# Patient Record
Sex: Male | Born: 1945
Health system: Southern US, Community
[De-identification: ages and names within clinical notes are randomized; demographics above are authoritative.]

## PROBLEM LIST (undated history)

## (undated) DIAGNOSIS — I10 Essential (primary) hypertension: Secondary | ICD-10-CM

## (undated) DIAGNOSIS — N138 Other obstructive and reflux uropathy: Secondary | ICD-10-CM

## (undated) DIAGNOSIS — C641 Malignant neoplasm of right kidney, except renal pelvis: Secondary | ICD-10-CM

## (undated) DIAGNOSIS — R7303 Prediabetes: Secondary | ICD-10-CM

## (undated) DIAGNOSIS — E7800 Pure hypercholesterolemia, unspecified: Secondary | ICD-10-CM

## (undated) DIAGNOSIS — N401 Enlarged prostate with lower urinary tract symptoms: Secondary | ICD-10-CM

## (undated) HISTORY — DX: Other obstructive and reflux uropathy: N13.8

## (undated) HISTORY — DX: Essential (primary) hypertension: I10

## (undated) HISTORY — DX: Pure hypercholesterolemia, unspecified: E78.00

## (undated) HISTORY — DX: Benign prostatic hyperplasia with lower urinary tract symptoms: N40.1

## (undated) HISTORY — DX: Prediabetes: R73.03

## (undated) HISTORY — DX: Malignant neoplasm of right kidney, except renal pelvis: C64.1

---

## 2005-09-08 HISTORY — PX: OTHER SURGICAL HISTORY: SHX169

## 2013-06-17 ENCOUNTER — Other Ambulatory Visit: Payer: Self-pay | Admitting: Internal Medicine

## 2013-06-17 DIAGNOSIS — R1900 Intra-abdominal and pelvic swelling, mass and lump, unspecified site: Secondary | ICD-10-CM

## 2013-06-22 ENCOUNTER — Ambulatory Visit
Admission: RE | Admit: 2013-06-22 | Discharge: 2013-06-22 | Disposition: A | Discharge status: Home / Self Care | Payer: Medicare Other | Source: Ambulatory Visit | Attending: Internal Medicine | Admitting: Internal Medicine

## 2013-06-22 DIAGNOSIS — R1900 Intra-abdominal and pelvic swelling, mass and lump, unspecified site: Secondary | ICD-10-CM

## 2013-06-22 MED ORDER — IOHEXOL 300 MG/ML  SOLN
100.0000 mL | Freq: Once | INTRAMUSCULAR | Status: AC | PRN
  Administered 2013-06-22: 100 mL via INTRAVENOUS

## 2013-07-13 ENCOUNTER — Other Ambulatory Visit: Payer: Self-pay | Admitting: Urology

## 2013-07-13 DIAGNOSIS — D49519 Neoplasm of unspecified behavior of unspecified kidney: Secondary | ICD-10-CM

## 2013-07-19 ENCOUNTER — Ambulatory Visit (HOSPITAL_COMMUNITY)
Admission: RE | Admit: 2013-07-19 | Discharge: 2013-07-19 | Disposition: A | Discharge status: Home / Self Care | Payer: Medicare Other | Source: Ambulatory Visit | Attending: Urology | Admitting: Urology

## 2013-07-19 DIAGNOSIS — D49519 Neoplasm of unspecified behavior of unspecified kidney: Secondary | ICD-10-CM

## 2013-07-19 DIAGNOSIS — N289 Disorder of kidney and ureter, unspecified: Secondary | ICD-10-CM | POA: Insufficient documentation

## 2013-07-19 DIAGNOSIS — D4959 Neoplasm of unspecified behavior of other genitourinary organ: Secondary | ICD-10-CM | POA: Insufficient documentation

## 2013-07-19 MED ORDER — GADOBENATE DIMEGLUMINE 529 MG/ML IV SOLN
17.0000 mL | Freq: Once | INTRAVENOUS | Status: AC | PRN
  Administered 2013-07-19: 17 mL via INTRAVENOUS

## 2013-07-27 ENCOUNTER — Other Ambulatory Visit: Payer: Self-pay | Admitting: Urology

## 2013-09-01 ENCOUNTER — Inpatient Hospital Stay (HOSPITAL_COMMUNITY): Admission: RE | Admit: 2013-09-01 | Payer: Medicare Other | Source: Ambulatory Visit | Admitting: Urology

## 2013-09-01 ENCOUNTER — Encounter (HOSPITAL_COMMUNITY): Admission: RE | Payer: Self-pay | Source: Ambulatory Visit

## 2013-09-01 SURGERY — ROBOTIC ASSISTED LAPAROSCOPIC NEPHRECTOMY
Anesthesia: General | Laterality: Right

## 2013-10-06 HISTORY — PX: OTHER SURGICAL HISTORY: SHX169

## 2016-03-26 DIAGNOSIS — Z0000 Encounter for general adult medical examination without abnormal findings: Secondary | ICD-10-CM | POA: Diagnosis not present

## 2016-03-26 DIAGNOSIS — N182 Chronic kidney disease, stage 2 (mild): Secondary | ICD-10-CM | POA: Diagnosis not present

## 2016-03-26 DIAGNOSIS — I129 Hypertensive chronic kidney disease with stage 1 through stage 4 chronic kidney disease, or unspecified chronic kidney disease: Secondary | ICD-10-CM | POA: Diagnosis not present

## 2016-03-26 DIAGNOSIS — E559 Vitamin D deficiency, unspecified: Secondary | ICD-10-CM | POA: Diagnosis not present

## 2016-03-26 DIAGNOSIS — E785 Hyperlipidemia, unspecified: Secondary | ICD-10-CM | POA: Diagnosis not present

## 2016-03-26 DIAGNOSIS — N390 Urinary tract infection, site not specified: Secondary | ICD-10-CM | POA: Diagnosis not present

## 2016-03-26 DIAGNOSIS — R7309 Other abnormal glucose: Secondary | ICD-10-CM | POA: Diagnosis not present

## 2016-04-13 IMAGING — CT CT ABD-PELV W/ CM
2 of 5 series · 13 of 36 positions shown, 18 images · IV contrast (READICAT/WATER & [ID] OMNI 300)
Comparison: Outside ultrasound report dated 06/09/2013.

CLINICAL DATA: Abdominal mass identified on outside ultrasound. No
current complaints.

EXAM:
CT ABDOMEN AND PELVIS WITH CONTRAST
TECHNIQUE: Multidetector CT imaging of the abdomen and pelvis was performed
using the standard protocol following bolus administration of
intravenous contrast.
CONTRAST:  100mL OMNIPAQUE IOHEXOL 300 MG/ML  SOLN

[Series 4: thin recons · axial · 0.78mm/px · z∈[-374,+49]mm · 12 of 392 slices shown, 16 images]
[im 36/392  soft-tissue]
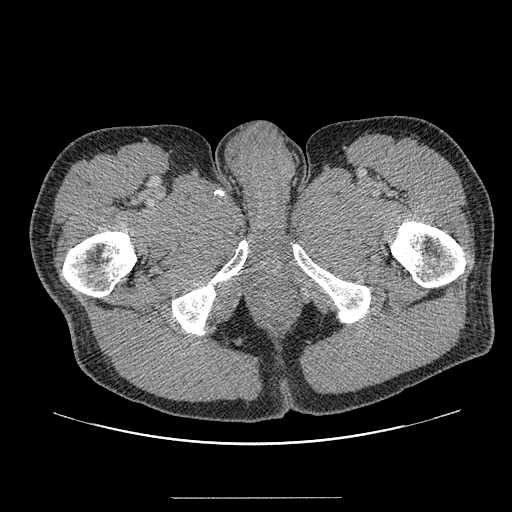
[im 36/392  bone]
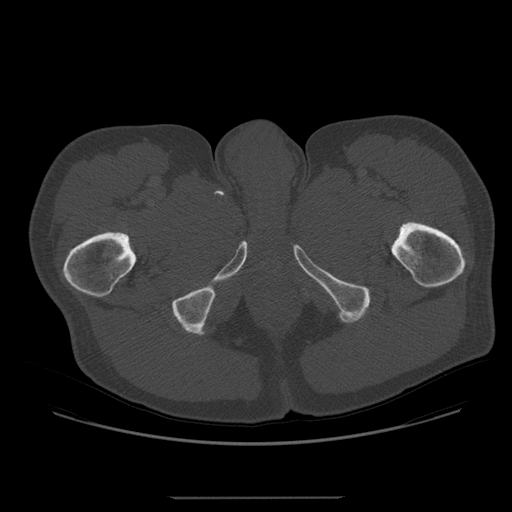
[im 72/392  soft-tissue]
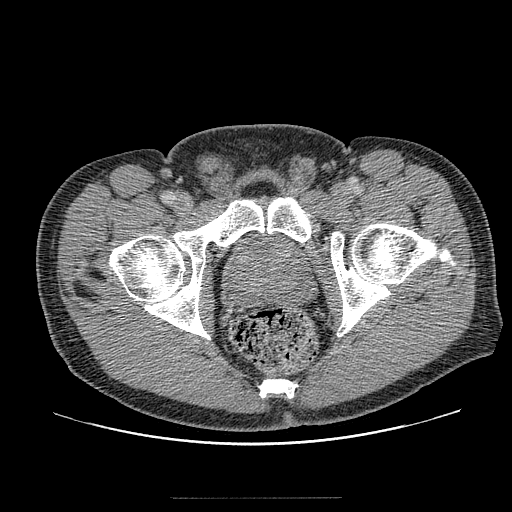
[im 107/392  soft-tissue]
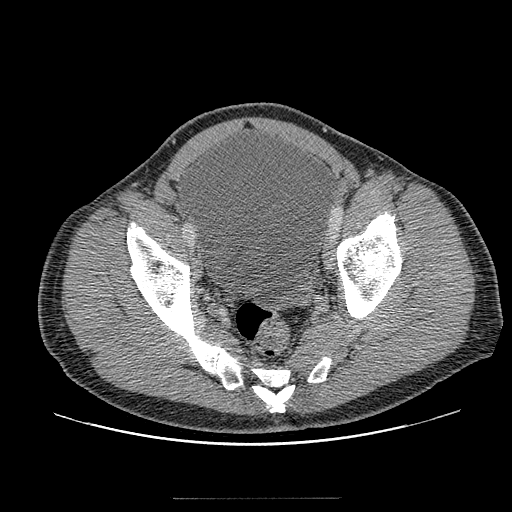
[im 143/392  soft-tissue]
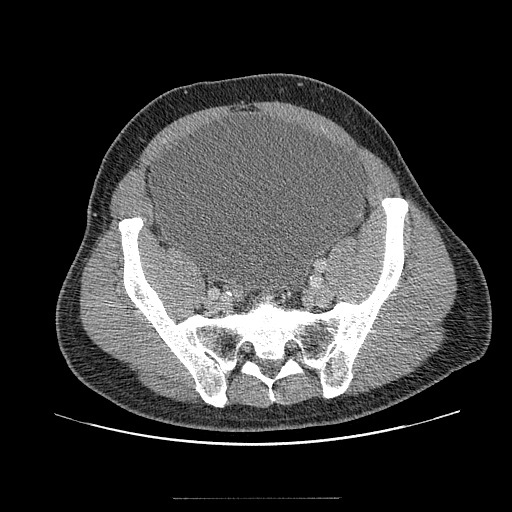
[im 178/392  soft-tissue]
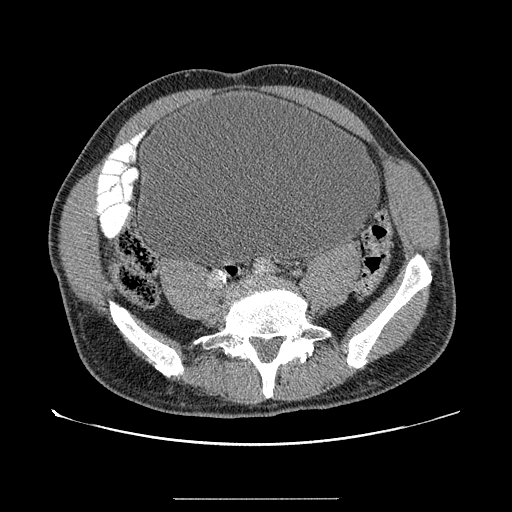
[im 214/392  soft-tissue]
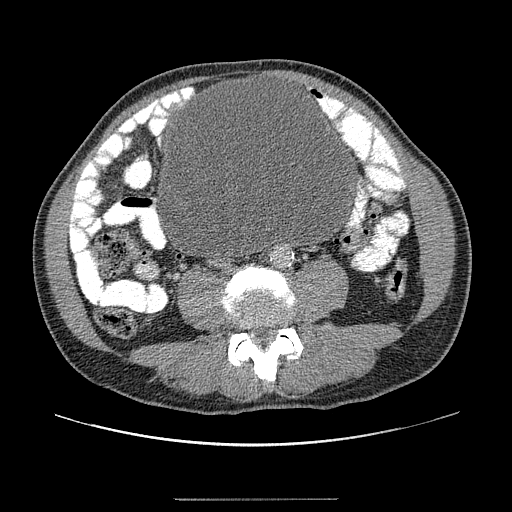
[im 249/392  soft-tissue]
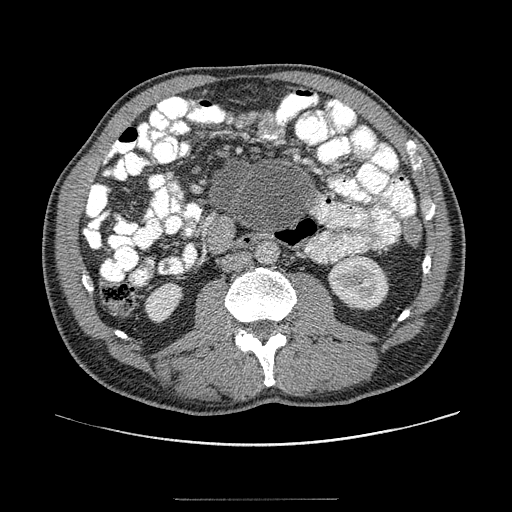
[im 285/392  soft-tissue]
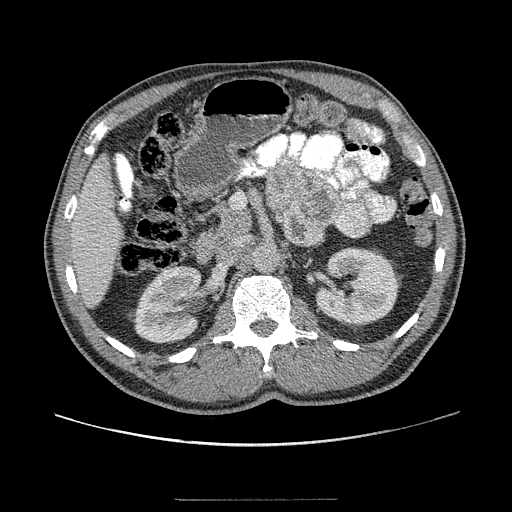
[im 320/392  soft-tissue]
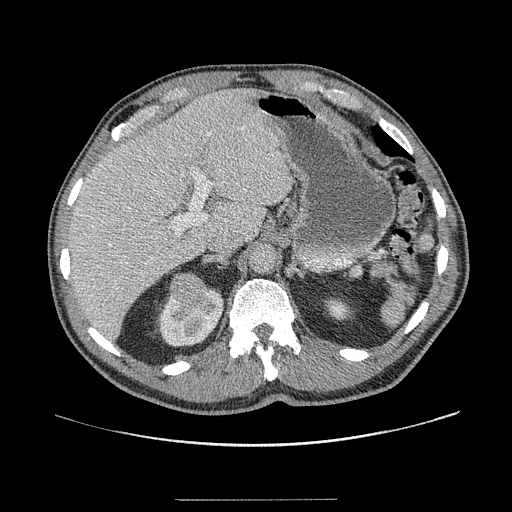
[im 320/392  lung]
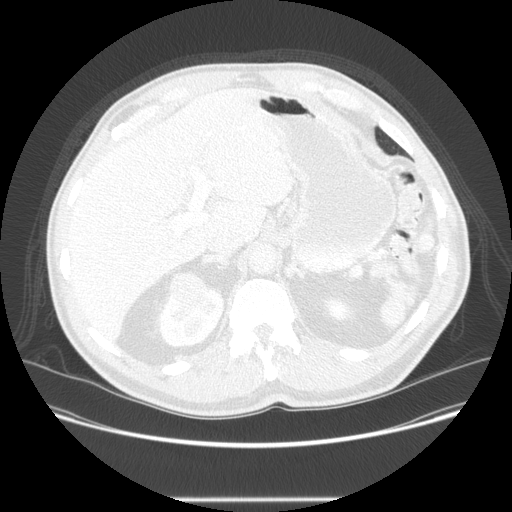
[im 320/392  bone]
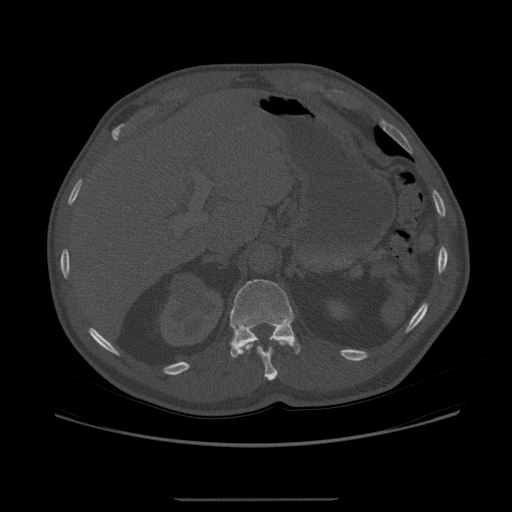
[im 338/392  lung]
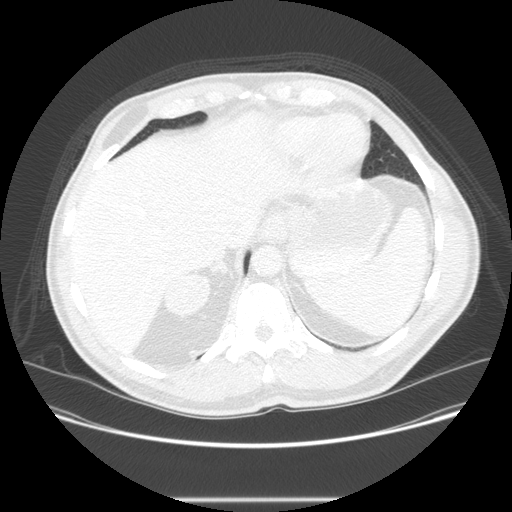
[im 356/392  soft-tissue]
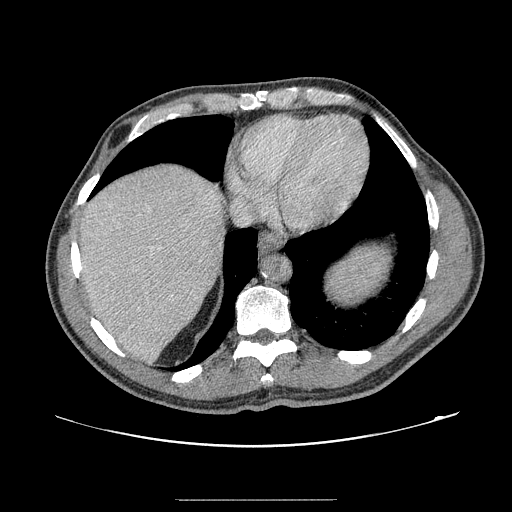
[im 356/392  lung]
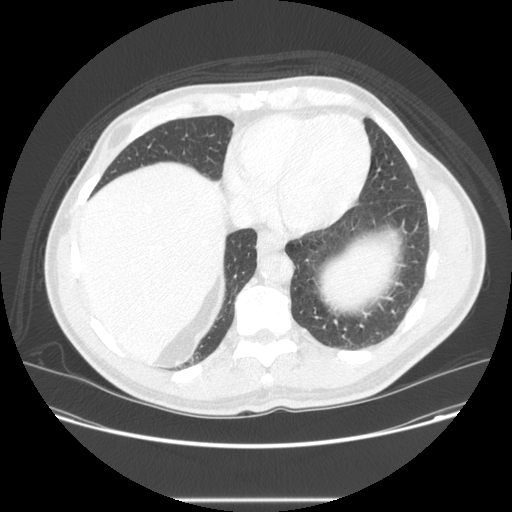
[im 374/392  lung]
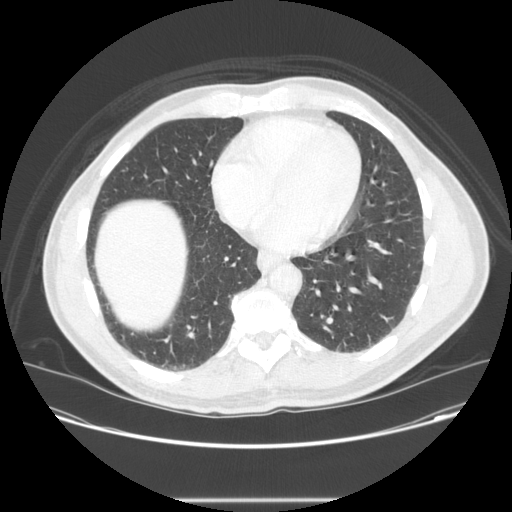

[Series 401: sagittal · sagittal · 0.98mm/px · 1 of 147 slices shown, 2 images]
[im 49/147  soft-tissue]
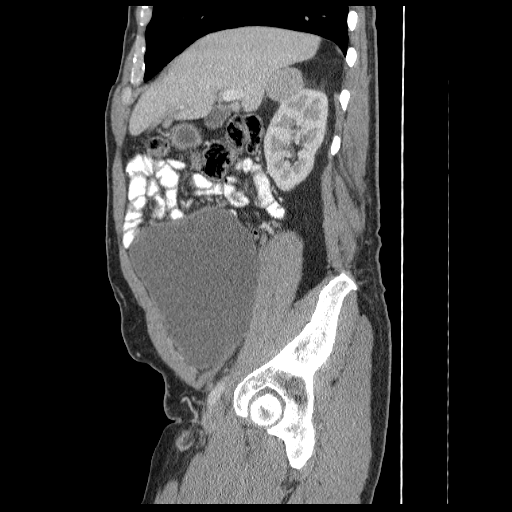
[im 49/147  bone]
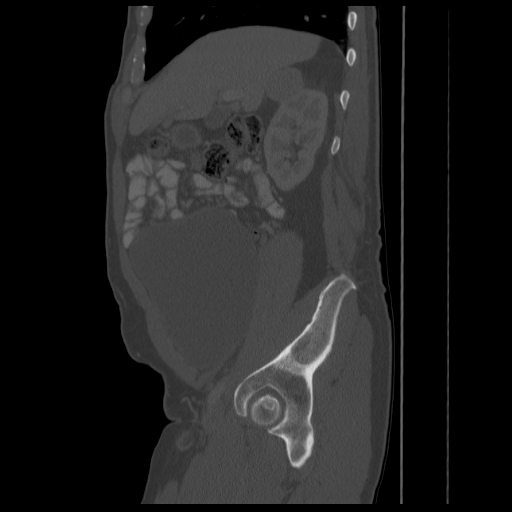

[13 of 36 positions shown; findings below may reference images not displayed]

FINDINGS: Lower Chest: Clear lung bases. Normal heart size without pericardial
or pleural effusion.

Abdomen/Pelvis: Normal liver, spleen, stomach. Mild pancreatic
atrophy. Normal gallbladder, biliary tract, adrenal glands, right
kidney.

An upper pole left renal mass measures 3.9 x 4.4 cm on image
17/series 2. Measures up to the 89 HU on corticomedullary phase
imaging. Appears to "Baksh" on nephrographic phase images.
Aortic and branch vessel atherosclerosis. No retroperitoneal or
retrocrural adenopathy. Right renal vein patent.

Moderate stool within the rectum. Scattered colonic diverticula.
Normal terminal ileum and appendix. Normal small bowel without
abdominal ascites. No pelvic adenopathy. Moderate prostatomegaly. A
pelvic cystic structure which extends into the mid abdomen (to the
level of the kidneys) is favored to represent a distended urinary
bladder. Measures maximally 23 cm craniocaudal. No significant free
fluid.

Bones/Musculoskeletal: Presumed degenerative partial fusion of the
bilateral sacroiliac joints. Left iliac sclerotic lesion which is
likely a bone island. Right intercostal lipoma measures 4.7 cm on
image 14.
IMPRESSION: 1. Cystic structure within the midline of the pelvis, extending into
the upper abdomen, is favored to represent a markedly dilated
urinary bladder. Consider focus (renal or pelvic) ultrasound with
pre and postvoid imaging to confirm. No separate compressed urinary
bladder is identified.
2. Upper pole right renal lesion, highly suspicious for renal cell
carcinoma. Urology consultation suggested. Definitive
characterization with pre and post contrast CT or MRI should be
considered. These results will be called to the ordering clinician
or representative by the Radiologist Assistant, and communication
documented in the PACS Dashboard.

## 2016-09-23 DIAGNOSIS — E785 Hyperlipidemia, unspecified: Secondary | ICD-10-CM | POA: Diagnosis not present

## 2016-09-23 DIAGNOSIS — N182 Chronic kidney disease, stage 2 (mild): Secondary | ICD-10-CM | POA: Diagnosis not present

## 2016-09-23 DIAGNOSIS — I129 Hypertensive chronic kidney disease with stage 1 through stage 4 chronic kidney disease, or unspecified chronic kidney disease: Secondary | ICD-10-CM | POA: Diagnosis not present

## 2016-09-23 DIAGNOSIS — R7309 Other abnormal glucose: Secondary | ICD-10-CM | POA: Diagnosis not present

## 2016-10-11 DIAGNOSIS — Z08 Encounter for follow-up examination after completed treatment for malignant neoplasm: Secondary | ICD-10-CM | POA: Diagnosis not present

## 2016-10-11 DIAGNOSIS — Z85528 Personal history of other malignant neoplasm of kidney: Secondary | ICD-10-CM | POA: Diagnosis not present

## 2016-10-11 DIAGNOSIS — N400 Enlarged prostate without lower urinary tract symptoms: Secondary | ICD-10-CM | POA: Diagnosis not present

## 2016-10-11 DIAGNOSIS — R911 Solitary pulmonary nodule: Secondary | ICD-10-CM | POA: Diagnosis not present

## 2016-10-11 DIAGNOSIS — M7989 Other specified soft tissue disorders: Secondary | ICD-10-CM | POA: Diagnosis not present

## 2016-10-11 DIAGNOSIS — C641 Malignant neoplasm of right kidney, except renal pelvis: Secondary | ICD-10-CM | POA: Diagnosis not present

## 2016-10-11 DIAGNOSIS — Z9889 Other specified postprocedural states: Secondary | ICD-10-CM | POA: Diagnosis not present

## 2016-10-11 DIAGNOSIS — G9589 Other specified diseases of spinal cord: Secondary | ICD-10-CM | POA: Diagnosis not present

## 2016-10-11 DIAGNOSIS — K449 Diaphragmatic hernia without obstruction or gangrene: Secondary | ICD-10-CM | POA: Diagnosis not present

## 2016-10-11 DIAGNOSIS — N138 Other obstructive and reflux uropathy: Secondary | ICD-10-CM | POA: Diagnosis not present

## 2016-10-11 DIAGNOSIS — M5384 Other specified dorsopathies, thoracic region: Secondary | ICD-10-CM | POA: Diagnosis not present

## 2016-10-11 DIAGNOSIS — N401 Enlarged prostate with lower urinary tract symptoms: Secondary | ICD-10-CM | POA: Diagnosis not present

## 2016-10-11 DIAGNOSIS — Z905 Acquired absence of kidney: Secondary | ICD-10-CM | POA: Diagnosis not present

## 2016-11-06 DIAGNOSIS — R937 Abnormal findings on diagnostic imaging of other parts of musculoskeletal system: Secondary | ICD-10-CM | POA: Diagnosis not present

## 2016-11-06 DIAGNOSIS — Z85528 Personal history of other malignant neoplasm of kidney: Secondary | ICD-10-CM | POA: Diagnosis not present

## 2016-12-19 DIAGNOSIS — M899 Disorder of bone, unspecified: Secondary | ICD-10-CM | POA: Diagnosis not present

## 2016-12-19 DIAGNOSIS — R948 Abnormal results of function studies of other organs and systems: Secondary | ICD-10-CM | POA: Diagnosis not present

## 2017-01-02 DIAGNOSIS — R935 Abnormal findings on diagnostic imaging of other abdominal regions, including retroperitoneum: Secondary | ICD-10-CM | POA: Diagnosis not present

## 2017-02-13 DIAGNOSIS — R933 Abnormal findings on diagnostic imaging of other parts of digestive tract: Secondary | ICD-10-CM | POA: Diagnosis not present

## 2017-02-13 DIAGNOSIS — K640 First degree hemorrhoids: Secondary | ICD-10-CM | POA: Diagnosis not present

## 2017-02-13 DIAGNOSIS — K5730 Diverticulosis of large intestine without perforation or abscess without bleeding: Secondary | ICD-10-CM | POA: Diagnosis not present

## 2017-04-17 DIAGNOSIS — N182 Chronic kidney disease, stage 2 (mild): Secondary | ICD-10-CM | POA: Diagnosis not present

## 2017-04-17 DIAGNOSIS — Z79899 Other long term (current) drug therapy: Secondary | ICD-10-CM | POA: Diagnosis not present

## 2017-04-17 DIAGNOSIS — I129 Hypertensive chronic kidney disease with stage 1 through stage 4 chronic kidney disease, or unspecified chronic kidney disease: Secondary | ICD-10-CM | POA: Diagnosis not present

## 2017-04-17 DIAGNOSIS — E785 Hyperlipidemia, unspecified: Secondary | ICD-10-CM | POA: Diagnosis not present

## 2017-04-29 DIAGNOSIS — R7309 Other abnormal glucose: Secondary | ICD-10-CM | POA: Diagnosis not present

## 2017-04-29 DIAGNOSIS — E785 Hyperlipidemia, unspecified: Secondary | ICD-10-CM | POA: Diagnosis not present

## 2017-04-29 DIAGNOSIS — I129 Hypertensive chronic kidney disease with stage 1 through stage 4 chronic kidney disease, or unspecified chronic kidney disease: Secondary | ICD-10-CM | POA: Diagnosis not present

## 2017-04-29 DIAGNOSIS — M545 Low back pain: Secondary | ICD-10-CM | POA: Diagnosis not present

## 2017-04-29 DIAGNOSIS — N182 Chronic kidney disease, stage 2 (mild): Secondary | ICD-10-CM | POA: Diagnosis not present

## 2017-09-04 DIAGNOSIS — R7309 Other abnormal glucose: Secondary | ICD-10-CM | POA: Diagnosis not present

## 2017-09-04 DIAGNOSIS — Z1389 Encounter for screening for other disorder: Secondary | ICD-10-CM | POA: Diagnosis not present

## 2017-09-04 DIAGNOSIS — N182 Chronic kidney disease, stage 2 (mild): Secondary | ICD-10-CM | POA: Diagnosis not present

## 2017-09-04 DIAGNOSIS — I129 Hypertensive chronic kidney disease with stage 1 through stage 4 chronic kidney disease, or unspecified chronic kidney disease: Secondary | ICD-10-CM | POA: Diagnosis not present

## 2017-09-04 LAB — HEPATIC FUNCTION PANEL
ALT: 21 (ref 10–40)
AST: 24 (ref 14–40)
Alkaline Phosphatase: 85 (ref 25–125)

## 2017-09-04 LAB — BASIC METABOLIC PANEL
Creatinine: 1.2 (ref 0.6–1.3)
GLUCOSE: 97
POTASSIUM: 3.5 (ref 3.4–5.3)
SODIUM: 142 (ref 137–147)

## 2017-09-04 LAB — HEMOGLOBIN A1C: Hemoglobin A1C: 6

## 2017-09-04 LAB — PSA: PSA: 0.7

## 2018-01-05 ENCOUNTER — Encounter: Payer: Self-pay | Admitting: Internal Medicine

## 2018-01-05 DIAGNOSIS — R7309 Other abnormal glucose: Secondary | ICD-10-CM | POA: Insufficient documentation

## 2018-01-08 ENCOUNTER — Encounter: Payer: Self-pay | Admitting: Internal Medicine

## 2018-01-08 ENCOUNTER — Ambulatory Visit: Payer: Medicare Other | Admitting: Internal Medicine

## 2018-01-08 VITALS — BP 130/78 | HR 82 | Temp 97.6°F | Ht 74.0 in | Wt 180.2 lb

## 2018-01-08 DIAGNOSIS — R7309 Other abnormal glucose: Secondary | ICD-10-CM

## 2018-01-08 DIAGNOSIS — N182 Chronic kidney disease, stage 2 (mild): Secondary | ICD-10-CM

## 2018-01-08 DIAGNOSIS — I129 Hypertensive chronic kidney disease with stage 1 through stage 4 chronic kidney disease, or unspecified chronic kidney disease: Secondary | ICD-10-CM | POA: Diagnosis not present

## 2018-01-08 DIAGNOSIS — E7800 Pure hypercholesterolemia, unspecified: Secondary | ICD-10-CM | POA: Diagnosis not present

## 2018-01-08 LAB — HEMOGLOBIN A1C
Est. average glucose Bld gHb Est-mCnc: 120 mg/dL
HEMOGLOBIN A1C: 5.8 % — AB (ref 4.8–5.6)

## 2018-01-08 LAB — CMP14+EGFR
ALBUMIN: 4.2 g/dL (ref 3.5–4.8)
ALK PHOS: 76 IU/L (ref 39–117)
ALT: 15 IU/L (ref 0–44)
AST: 17 IU/L (ref 0–40)
Albumin/Globulin Ratio: 1.4 (ref 1.2–2.2)
BILIRUBIN TOTAL: 0.5 mg/dL (ref 0.0–1.2)
BUN/Creatinine Ratio: 17 (ref 10–24)
BUN: 20 mg/dL (ref 8–27)
CHLORIDE: 100 mmol/L (ref 96–106)
CO2: 28 mmol/L (ref 20–29)
Calcium: 9.7 mg/dL (ref 8.6–10.2)
Creatinine, Ser: 1.16 mg/dL (ref 0.76–1.27)
GFR calc Af Amer: 72 mL/min/{1.73_m2} (ref >59)
GFR calc non Af Amer: 62 mL/min/{1.73_m2} (ref >59)
GLUCOSE: 88 mg/dL (ref 65–99)
Globulin, Total: 2.9 g/dL (ref 1.5–4.5)
Potassium: 3.6 mmol/L (ref 3.5–5.2)
Sodium: 142 mmol/L (ref 134–144)
TOTAL PROTEIN: 7.1 g/dL (ref 6.0–8.5)

## 2018-01-08 LAB — LIPID PANEL
CHOLESTEROL TOTAL: 164 mg/dL (ref 100–199)
Chol/HDL Ratio: 3.1 ratio (ref 0.0–5.0)
HDL: 53 mg/dL (ref >39)
LDL Calculated: 101 mg/dL — ABNORMAL HIGH (ref 0–99)
TRIGLYCERIDES: 51 mg/dL (ref 0–149)
VLDL Cholesterol Cal: 10 mg/dL (ref 5–40)

## 2018-01-08 NOTE — Progress Notes (Signed)
Subjective:     Patient ID: Lawrence Harrell , male    DOB: 1945/09/05 , 72 y.o.   MRN: [id number]   Chief Complaint  Patient presents with  . Hypertension  . Hyperlipidemia    HPI  Hypertension  This is a chronic problem. The current episode started more than 1 month ago. The problem has been rapidly worsening since onset. The problem is controlled. Pertinent negatives include no blurred vision, chest pain, headaches, neck pain or shortness of breath. Risk factors for coronary artery disease include dyslipidemia and male gender. The current treatment provides moderate improvement. There are no compliance problems.    He reports compliance with meds.   Hyperlipidemia  He is currently taking pravastatin without any issues. He denies having any muscle aches or other side effects from the medication.   Past Medical History:  Diagnosis Date  . BPH with obstruction/lower urinary tract symptoms   . High cholesterol   . HTN (hypertension)   . Prediabetes   . Renal cell cancer, right (HCC)      Family History  Problem Relation Age of Onset  . Hypertension Mother   . Hypertension Father      Current Outpatient Medications:  .  amLODipine (NORVASC) 10 MG tablet, Take 10 mg by mouth daily., Disp: , Rfl:  .  Cholecalciferol (VITAMIN D3) 2000 units TABS, Take by mouth., Disp: , Rfl:  .  pravastatin (PRAVACHOL) 40 MG tablet, Take 40 mg by mouth daily., Disp: , Rfl:  .  tamsulosin (FLOMAX) 0.4 MG CAPS capsule, Take 0.4 mg by mouth., Disp: , Rfl:  .  valsartan-hydrochlorothiazide (DIOVAN-HCT) 160-25 MG tablet, Take 1 tablet by mouth daily., Disp: , Rfl:    No Known Allergies   Review of Systems  Constitutional: Negative.   Eyes: Negative for blurred vision.  Respiratory: Negative.  Negative for shortness of breath.   Cardiovascular: Negative.  Negative for chest pain.  Gastrointestinal: Negative.   Endocrine: Negative.   Genitourinary: Negative.   Musculoskeletal: Negative for  neck pain.  Skin: Negative.   Neurological: Negative for headaches.  Hematological: Negative.   Psychiatric/Behavioral: Negative.      Today's Vitals   01/08/18 0906  BP: 130/78  Pulse: 82  Temp: 97.6 F (36.4 C)  TempSrc: Oral  Weight: 180 lb 3.2 oz (81.7 kg)  Height: 6' 2" (1.88 m)  PainSc: 2   PainLoc: Back   Body mass index is 23.14 kg/m.   Objective:  Physical Exam  Constitutional: He is oriented to person, place, and time. He appears well-developed and well-nourished.  HENT:  Head: Normocephalic.  Eyes: EOM are normal.  Cardiovascular: Normal rate, regular rhythm and normal heart sounds.  Pulmonary/Chest: Effort normal and breath sounds normal.  Neurological: He is alert and oriented to person, place, and time.  Skin: Skin is warm and dry.  Psychiatric: He has a normal mood and affect.  Nursing note and vitals reviewed.       Assessment And Plan:     1. Hypertensive nephropathy  Controlled. He will continue with current meds. He is encouraged to avoid adding salt to his foods.   - CMP14+EGFR  2. Chronic renal disease, stage II  Chronic. I will check GFR, CR today. He is encouraged to stay well hydrated. Importance of optimal BP control is also discussed with the patient.   3. Pure hypercholesterolemia  I will check fasting lipid panel today, along with LFTs.  HE IS ENCOURAGED TO EXERCISE FIVE DAYS WEEKLY FOR  AT LEAST 30 MINUTES, AVOID FRIED FOODS, EAT 25-35 GRAMS OF FIBER, AND TO EAT FISH AT LEAST TWICE WEEKLY.  - Lipid Profile  4. Other abnormal glucose  HIS A1C HAS BEEN ELEVATED IN THE PAST. I WILL CHECK AN A1C, BMET TODAY. HE IS ENCOURAGED TO AVOID SUGARY BEVERAGES AND PROCESSED FOODS INCLUDNG BREADS, RICE AND PASTA.   - Hemoglobin A1c        Maximino Greenland, MD

## 2018-01-09 ENCOUNTER — Encounter: Payer: Self-pay | Admitting: Internal Medicine

## 2018-01-09 NOTE — Progress Notes (Signed)
Your liver and kidney function are stable. Your hba1c is 5.8, this is better than last visit. Your chol is great. Continue with current meds.

## 2018-01-11 ENCOUNTER — Encounter: Payer: Self-pay | Admitting: Internal Medicine

## 2018-01-12 ENCOUNTER — Telehealth: Payer: Self-pay

## 2018-01-12 NOTE — Telephone Encounter (Signed)
-----   Message from Glendale Chard, MD sent at 01/09/2018  8:48 PM EDT ----- Your liver and kidney function are stable. Your hba1c is 5.8, this is better than last visit. Your chol is great. Continue with current meds.

## 2018-01-12 NOTE — Telephone Encounter (Signed)
Left the pt a message to call back for his lab results. 

## 2018-02-04 ENCOUNTER — Other Ambulatory Visit: Payer: Self-pay | Admitting: Internal Medicine

## 2018-04-06 ENCOUNTER — Ambulatory Visit (INDEPENDENT_AMBULATORY_CARE_PROVIDER_SITE_OTHER): Payer: Medicare Other | Admitting: Internal Medicine

## 2018-04-06 ENCOUNTER — Encounter: Payer: Self-pay | Admitting: Internal Medicine

## 2018-04-06 VITALS — BP 120/76 | HR 82 | Temp 98.0°F | Ht 74.0 in | Wt 183.0 lb

## 2018-04-06 DIAGNOSIS — E7800 Pure hypercholesterolemia, unspecified: Secondary | ICD-10-CM | POA: Diagnosis not present

## 2018-04-06 DIAGNOSIS — R7309 Other abnormal glucose: Secondary | ICD-10-CM

## 2018-04-06 DIAGNOSIS — I129 Hypertensive chronic kidney disease with stage 1 through stage 4 chronic kidney disease, or unspecified chronic kidney disease: Secondary | ICD-10-CM

## 2018-04-06 DIAGNOSIS — N182 Chronic kidney disease, stage 2 (mild): Secondary | ICD-10-CM

## 2018-04-06 NOTE — Progress Notes (Addendum)
Subjective:     Patient ID: Lawrence Harrell , male    DOB: [birth year] , 74 y.o.   MRN: [id number]   Chief Complaint  Patient presents with  . Hypertension    HPI  Hypertension  This is a chronic problem. The current episode started more than 1 year ago. The problem has been gradually improving since onset. The problem is controlled. Pertinent negatives include no blurred vision, chest pain, palpitations or shortness of breath. Risk factors for coronary artery disease include dyslipidemia and male gender. The current treatment provides moderate improvement. Hypertensive end-organ damage includes kidney disease.     Past Medical History:  Diagnosis Date  . BPH with obstruction/lower urinary tract symptoms   . High cholesterol   . HTN (hypertension)   . Prediabetes   . Renal cell cancer, right (HCC)      Family History  Problem Relation Age of Onset  . Hypertension Mother   . Hypertension Father      Current Outpatient Medications:  .  amLODipine (NORVASC) 10 MG tablet, TAKE 1 TABLET BY MOUTH  EVERY DAY, Disp: 90 tablet, Rfl: 1 .  Cholecalciferol (VITAMIN D3) 2000 units TABS, Take by mouth., Disp: , Rfl:  .  pravastatin (PRAVACHOL) 40 MG tablet, Take 40 mg by mouth daily., Disp: , Rfl:  .  tamsulosin (FLOMAX) 0.4 MG CAPS capsule, Take 0.4 mg by mouth., Disp: , Rfl:  .  valsartan-hydrochlorothiazide (DIOVAN-HCT) 160-25 MG tablet, TAKE 1 TABLET BY MOUTH  EVERY DAY, Disp: 90 tablet, Rfl: 1   No Known Allergies   Review of Systems  Constitutional: Negative.   Eyes: Negative for blurred vision.  Respiratory: Negative.  Negative for shortness of breath.   Cardiovascular: Negative.  Negative for chest pain and palpitations.  Gastrointestinal: Negative.   Neurological: Negative.   Psychiatric/Behavioral: Negative.      Today's Vitals   04/06/18 1027  BP: 120/76  Pulse: 82  Temp: 98 F (36.7 C)  TempSrc: Other (Comment)  SpO2: 98%  Weight: 183 lb (83 kg)  Height: 6\' 2"   (1.88 m)   Body mass index is 23.5 kg/m.   Objective:  Physical Exam Vitals signs and nursing note reviewed.  Constitutional:      Appearance: Normal appearance.  HENT:     Head: Normocephalic and atraumatic.  Cardiovascular:     Rate and Rhythm: Normal rate and regular rhythm.     Heart sounds: Normal heart sounds.  Pulmonary:     Effort: Pulmonary effort is normal.     Breath sounds: Normal breath sounds.  Skin:    General: Skin is warm.  Neurological:     General: No focal deficit present.     Mental Status: He is alert.  Psychiatric:        Mood and Affect: Mood normal.         Assessment And Plan:     1. Hypertensive nephropathy  Well controlled. He will continue with current meds. He is encouraged to avoid adding salt to his foods.   2. Chronic renal disease, stage II  Chronic. He is encouraged to stay well hydrated. I will recheck GFR, Cr in April 2020 when he returns for his AWV/physical examination.   3. Abnormal glucose  HIS  A1C HAS BEEN ELEVATED IN THE PAST. I WILL CHECK AN A1C, BMET TODAY. HE IS ENCOURAGED TO AVOID SUGARY BEVERAGES AND PROCESSED FOODS INCLUDNG BREADS, RICE AND PASTA.  - Hemoglobin A1c  4. Pure hypercholesterolemia  Chronic. He  is encouraged to limit his fried food intake and to exercise no less than five days weekly, 30 minutes each day. His most recent labs were reviewed in full detail during his visit. We also discussed how excess sugars can impact his cholesterol.   Maximino Greenland, MD

## 2018-04-09 ENCOUNTER — Ambulatory Visit: Payer: Medicare Other | Admitting: Internal Medicine

## 2018-05-14 ENCOUNTER — Ambulatory Visit: Payer: Self-pay

## 2018-05-14 ENCOUNTER — Ambulatory Visit: Payer: Self-pay | Admitting: Internal Medicine

## 2018-06-09 ENCOUNTER — Other Ambulatory Visit: Payer: Self-pay | Admitting: Internal Medicine

## 2018-07-30 ENCOUNTER — Encounter: Payer: Self-pay | Admitting: Internal Medicine

## 2018-07-30 ENCOUNTER — Ambulatory Visit: Payer: Self-pay

## 2018-08-06 ENCOUNTER — Encounter: Payer: Self-pay | Admitting: Internal Medicine

## 2018-08-06 ENCOUNTER — Ambulatory Visit: Payer: Self-pay

## 2018-08-10 ENCOUNTER — Other Ambulatory Visit: Payer: Self-pay | Admitting: Internal Medicine

## 2018-08-11 ENCOUNTER — Telehealth: Payer: Self-pay

## 2018-08-11 NOTE — Telephone Encounter (Signed)
Pt need appt for medication refill

## 2018-08-13 ENCOUNTER — Telehealth: Payer: Self-pay

## 2018-08-13 NOTE — Telephone Encounter (Signed)
This nurse attempted to call patient in order to set up an [place] appointment. Message was  Left for him to call back.

## 2018-08-24 ENCOUNTER — Other Ambulatory Visit: Payer: Self-pay

## 2018-08-24 MED ORDER — AMLODIPINE BESYLATE 10 MG PO TABS: 10.0000 mg | ORAL_TABLET | Freq: Every day | ORAL | 1 refills | Status: DC

## 2018-08-24 MED ORDER — VALSARTAN-HYDROCHLOROTHIAZIDE 160-25 MG PO TABS: 1.0000 | ORAL_TABLET | Freq: Every day | ORAL | 1 refills | Status: DC

## 2018-08-27 ENCOUNTER — Telehealth: Payer: Self-pay | Admitting: Internal Medicine

## 2018-08-27 NOTE — Telephone Encounter (Signed)
I left a message asking the patient to call and reschedule AWV and physical. VDM (DD)

## 2018-09-08 ENCOUNTER — Telehealth: Payer: Self-pay | Admitting: Internal Medicine

## 2018-09-08 NOTE — Telephone Encounter (Signed)
I left a message asking the patient to call and reschedule AWV/CPE. VDM (DD)

## 2018-09-25 ENCOUNTER — Telehealth: Payer: Self-pay | Admitting: Internal Medicine

## 2018-09-25 NOTE — Telephone Encounter (Signed)
I left a message asking the patient to call me at [phone] to schedule AWV w/ Pamala Hurry. VDM (DD)

## 2018-10-21 ENCOUNTER — Other Ambulatory Visit: Payer: Self-pay | Admitting: Internal Medicine

## 2018-11-20 ENCOUNTER — Other Ambulatory Visit: Payer: Self-pay

## 2018-11-20 MED ORDER — AMLODIPINE BESYLATE 10 MG PO TABS: 10.0000 mg | ORAL_TABLET | Freq: Every day | ORAL | 1 refills | Status: DC

## 2018-11-20 MED ORDER — VALSARTAN-HYDROCHLOROTHIAZIDE 160-25 MG PO TABS: 1.0000 | ORAL_TABLET | Freq: Every day | ORAL | 1 refills | Status: DC

## 2019-01-22 ENCOUNTER — Telehealth: Payer: Self-pay | Admitting: Internal Medicine

## 2019-01-22 NOTE — Telephone Encounter (Signed)
I called the patient to schedule 2020 AWV, but there was no answer.

## 2019-04-22 ENCOUNTER — Ambulatory Visit (INDEPENDENT_AMBULATORY_CARE_PROVIDER_SITE_OTHER): Payer: Medicare Other

## 2019-04-22 ENCOUNTER — Encounter: Payer: Self-pay | Admitting: Internal Medicine

## 2019-04-22 ENCOUNTER — Other Ambulatory Visit: Payer: Self-pay

## 2019-04-22 ENCOUNTER — Ambulatory Visit (INDEPENDENT_AMBULATORY_CARE_PROVIDER_SITE_OTHER): Payer: Medicare Other | Admitting: Internal Medicine

## 2019-04-22 VITALS — BP 132/82 | HR 94 | Temp 98.0°F | Ht 70.2 in | Wt 180.0 lb

## 2019-04-22 DIAGNOSIS — E7800 Pure hypercholesterolemia, unspecified: Secondary | ICD-10-CM

## 2019-04-22 DIAGNOSIS — R1900 Intra-abdominal and pelvic swelling, mass and lump, unspecified site: Secondary | ICD-10-CM

## 2019-04-22 DIAGNOSIS — I129 Hypertensive chronic kidney disease with stage 1 through stage 4 chronic kidney disease, or unspecified chronic kidney disease: Secondary | ICD-10-CM

## 2019-04-22 DIAGNOSIS — Z0000 Encounter for general adult medical examination without abnormal findings: Secondary | ICD-10-CM

## 2019-04-22 DIAGNOSIS — Z0001 Encounter for general adult medical examination with abnormal findings: Secondary | ICD-10-CM

## 2019-04-22 DIAGNOSIS — N182 Chronic kidney disease, stage 2 (mild): Secondary | ICD-10-CM

## 2019-04-22 DIAGNOSIS — R7309 Other abnormal glucose: Secondary | ICD-10-CM | POA: Diagnosis not present

## 2019-04-22 DIAGNOSIS — R351 Nocturia: Secondary | ICD-10-CM

## 2019-04-22 LAB — POCT URINALYSIS DIPSTICK
Bilirubin, UA: NEGATIVE
Blood, UA: NEGATIVE
Glucose, UA: NEGATIVE
Ketones, UA: NEGATIVE
Leukocytes, UA: NEGATIVE
Nitrite, UA: NEGATIVE
Protein, UA: NEGATIVE
Spec Grav, UA: >=1.03 — AB (ref 1.010–1.025)
Urobilinogen, UA: 1 E.U./dL
pH, UA: 6 (ref 5.0–8.0)

## 2019-04-22 LAB — POCT UA - MICROALBUMIN
Albumin/Creatinine Ratio, Urine, POC: <30
Creatinine, POC: 300 mg/dL
Microalbumin Ur, POC: 30 mg/L

## 2019-04-22 MED ORDER — VALSARTAN-HYDROCHLOROTHIAZIDE 160-25 MG PO TABS: 1.0000 | ORAL_TABLET | Freq: Every day | ORAL | 2 refills | Status: DC

## 2019-04-22 MED ORDER — PRAVASTATIN SODIUM 40 MG PO TABS: 40.0000 mg | ORAL_TABLET | Freq: Every day | ORAL | 3 refills | Status: DC

## 2019-04-22 MED ORDER — AMLODIPINE BESYLATE 10 MG PO TABS: 10.0000 mg | ORAL_TABLET | Freq: Every day | ORAL | 2 refills | Status: DC

## 2019-04-22 NOTE — Patient Instructions (Signed)

## 2019-04-22 NOTE — Patient Instructions (Signed)
Lawrence Harrell , Thank you for taking time to come for your Medicare Wellness Visit. I appreciate your ongoing commitment to your health goals. Please review the following plan we discussed and let me know if I can assist you in the future.   Screening recommendations/referrals: Colonoscopy: 02/2017 Recommended yearly ophthalmology/optometry visit for glaucoma screening and checkup Recommended yearly dental visit for hygiene and checkup  Vaccinations: Influenza vaccine: decline Pneumococcal vaccine: 11/2011 Tdap vaccine: 11/2011 Shingles vaccine: discussed    Advanced directives: Please bring a copy of your POA (Power of [place]) and/or Living Will to your next appointment.   Conditions/risks identified: overweight  Next appointment: 08/11/2019 at 10:00  Preventive Care 75 Years and Older, Male Preventive care refers to lifestyle choices and visits with your health care provider that can promote health and wellness. What does preventive care include?  A yearly physical exam. This is also called an annual well check.  Dental exams once or twice a year.  Routine eye exams. Ask your health care provider how often you should have your eyes checked.  Personal lifestyle choices, including:  Daily care of your teeth and gums.  Regular physical activity.  Eating a healthy diet.  Avoiding tobacco and drug use.  Limiting alcohol use.  Practicing safe sex.  Taking low doses of aspirin every day.  Taking vitamin and mineral supplements as recommended by your health care provider. What happens during an annual well check? The services and screenings done by your health care provider during your annual well check will depend on your age, overall health, lifestyle risk factors, and family history of disease. Counseling  Your health care provider may ask you questions about your:  Alcohol use.  Tobacco use.  Drug use.  Emotional well-being.  Home and relationship  well-being.  Sexual activity.  Eating habits.  History of falls.  Memory and ability to understand (cognition).  Work and work Statistician. Screening  You may have the following tests or measurements:  Height, weight, and BMI.  Blood pressure.  Lipid and cholesterol levels. These may be checked every 5 years, or more frequently if you are over 64 years old.  Skin check.  Lung cancer screening. You may have this screening every year starting at age 105 if you have a 30-pack-year history of smoking and currently smoke or have quit within the past 15 years.  Fecal occult blood test (FOBT) of the stool. You may have this test every year starting at age 104.  Flexible sigmoidoscopy or colonoscopy. You may have a sigmoidoscopy every 5 years or a colonoscopy every 10 years starting at age 80.  Prostate cancer screening. Recommendations will vary depending on your family history and other risks.  Hepatitis C blood test.  Hepatitis B blood test.  Sexually transmitted disease (STD) testing.  Diabetes screening. This is done by checking your blood sugar (glucose) after you have not eaten for a while (fasting). You may have this done every 1-3 years.  Abdominal aortic aneurysm (AAA) screening. You may need this if you are a current or former smoker.  Osteoporosis. You may be screened starting at age 49 if you are at high risk. Talk with your health care provider about your test results, treatment options, and if necessary, the need for more tests. Vaccines  Your health care provider may recommend certain vaccines, such as:  Influenza vaccine. This is recommended every year.  Tetanus, diphtheria, and acellular pertussis (Tdap, Td) vaccine. You may need a Td booster every 10  years.  Zoster vaccine. You may need this after age 32.  Pneumococcal 13-valent conjugate (PCV13) vaccine. One dose is recommended after age 27.  Pneumococcal polysaccharide (PPSV23) vaccine. One dose is  recommended after age 67. Talk to your health care provider about which screenings and vaccines you need and how often you need them. This information is not intended to replace advice given to you by your health care provider. Make sure you discuss any questions you have with your health care provider. Document Released: 03/24/2015 Document Revised: 11/15/2015 Document Reviewed: 12/27/2014 Elsevier Interactive Patient Education  2017 [place] Prevention in the Home Falls can cause injuries. They can happen to people of all ages. There are many things you can do to make your home safe and to help prevent falls. What can I do on the outside of my home?  Regularly fix the edges of walkways and driveways and fix any cracks.  Remove anything that might make you trip as you walk through a door, such as a raised step or threshold.  Trim any bushes or trees on the path to your home.  Use bright outdoor lighting.  Clear any walking paths of anything that might make someone trip, such as rocks or tools.  Regularly check to see if handrails are loose or broken. Make sure that both sides of any steps have handrails.  Any raised decks and porches should have guardrails on the edges.  Have any leaves, snow, or ice cleared regularly.  Use sand or salt on walking paths during winter.  Clean up any spills in your garage right away. This includes oil or grease spills. What can I do in the bathroom?  Use night lights.  Install grab bars by the toilet and in the tub and shower. Do not use towel bars as grab bars.  Use non-skid mats or decals in the tub or shower.  If you need to sit down in the shower, use a plastic, non-slip stool.  Keep the floor dry. Clean up any water that spills on the floor as soon as it happens.  Remove soap buildup in the tub or shower regularly.  Attach bath mats securely with double-sided non-slip rug tape.  Do not have throw rugs and other things on  the floor that can make you trip. What can I do in the bedroom?  Use night lights.  Make sure that you have a light by your bed that is easy to reach.  Do not use any sheets or blankets that are too big for your bed. They should not hang down onto the floor.  Have a firm chair that has side arms. You can use this for support while you get dressed.  Do not have throw rugs and other things on the floor that can make you trip. What can I do in the kitchen?  Clean up any spills right away.  Avoid walking on wet floors.  Keep items that you use a lot in easy-to-reach places.  If you need to reach something above you, use a strong step stool that has a grab bar.  Keep electrical cords out of the way.  Do not use floor polish or wax that makes floors slippery. If you must use wax, use non-skid floor wax.  Do not have throw rugs and other things on the floor that can make you trip. What can I do with my stairs?  Do not leave any items on the stairs.  Make sure that  there are handrails on both sides of the stairs and use them. Fix handrails that are broken or loose. Make sure that handrails are as long as the stairways.  Check any carpeting to make sure that it is firmly attached to the stairs. Fix any carpet that is loose or worn.  Avoid having throw rugs at the top or bottom of the stairs. If you do have throw rugs, attach them to the floor with carpet tape.  Make sure that you have a light switch at the top of the stairs and the bottom of the stairs. If you do not have them, ask someone to add them for you. What else can I do to help prevent falls?  Wear shoes that:  Do not have high heels.  Have rubber bottoms.  Are comfortable and fit you well.  Are closed at the toe. Do not wear sandals.  If you use a stepladder:  Make sure that it is fully opened. Do not climb a closed stepladder.  Make sure that both sides of the stepladder are locked into place.  Ask someone to  hold it for you, if possible.  Clearly mark and make sure that you can see:  Any grab bars or handrails.  First and last steps.  Where the edge of each step is.  Use tools that help you move around (mobility aids) if they are needed. These include:  Canes.  Walkers.  Scooters.  Crutches.  Turn on the lights when you go into a dark area. Replace any light bulbs as soon as they burn out.  Set up your furniture so you have a clear path. Avoid moving your furniture around.  If any of your floors are uneven, fix them.  If there are any pets around you, be aware of where they are.  Review your medicines with your doctor. Some medicines can make you feel dizzy. This can increase your chance of falling. Ask your doctor what other things that you can do to help prevent falls. This information is not intended to replace advice given to you by your health care provider. Make sure you discuss any questions you have with your health care provider. Document Released: 12/22/2008 Document Revised: 08/03/2015 Document Reviewed: 04/01/2014 Elsevier Interactive Patient Education  2017 [employer].

## 2019-04-22 NOTE — Progress Notes (Signed)
This visit occurred during the SARS-CoV-2 public health emergency.  Safety protocols were in place, including screening questions prior to the visit, additional usage of staff PPE, and extensive cleaning of exam room while observing appropriate contact time as indicated for disinfecting solutions.  Subjective:   Lawrence Harrell is a 74 y.o. male who presents for Medicare Annual/Subsequent preventive examination.  Review of Systems:  n/a Cardiac Risk Factors include: advanced age (>30men, >67 women);hypertension;male gender;sedentary lifestyle     Objective:    Vitals: BP 132/82 (BP Location: Left Arm, Patient Position: Sitting, Cuff Size: Normal)   Pulse 94   Temp 98 F (36.7 C) (Oral)   Ht 5' 10.2" (1.783 m)   Wt 180 lb (81.6 kg)   BMI 25.68 kg/m   Body mass index is 25.68 kg/m.  Advanced Directives 04/22/2019  Does Patient Have a Medical Advance Directive? Yes  Type of Paramedic of [place];Living will  Copy of [place] in Chart? No - copy requested    Tobacco Social History   Tobacco Use  Smoking Status Never Smoker  Smokeless Tobacco Never Used     Counseling given: Not Answered   Clinical Intake:  Pre-visit preparation completed: Yes  Pain : No/denies pain     Nutritional Status: BMI 25 -29 Overweight Nutritional Risks: None Diabetes: No  How often do you need to have someone help you when you read instructions, pamphlets, or other written materials from your doctor or pharmacy?: 1 - Never What is the last grade level you completed in school?: 2 years college  Interpreter Needed?: No  Information entered by :: NAllen LPN  Past Medical History:  Diagnosis Date  . BPH with obstruction/lower urinary tract symptoms   . High cholesterol   . HTN (hypertension)   . Prediabetes   . Renal cell cancer, right [facility])    Past Surgical History:  Procedure Laterality Date  . gland extraction  09/2005   Salivary   .  nephrectomy  10/06/2013   partial    Family History  Problem Relation Age of Onset  . Hypertension Mother   . Hypertension Father    Social History   Socioeconomic History  . Marital status: Married    Spouse name: Not on file  . Number of children: Not on file  . Years of education: Not on file  . Highest education level: Not on file  Occupational History  . Not on file  Tobacco Use  . Smoking status: Never Smoker  . Smokeless tobacco: Never Used  Substance and Sexual Activity  . Alcohol use: Never  . Drug use: Never  . Sexual activity: Yes  Other Topics Concern  . Not on file  Social History Narrative  . Not on file   Social Determinants of Health   Financial Resource Strain: Low Risk   . Difficulty of Paying Living Expenses: Not hard at all  Food Insecurity: No Food Insecurity  . Worried About Charity fundraiser in the Last Year: Never true  . Ran Out of Food in the Last Year: Never true  Transportation Needs: No Transportation Needs  . Lack of Transportation (Medical): No  . Lack of Transportation (Non-Medical): No  Physical Activity: Insufficiently Active  . Days of Exercise per Week: 7 days  . Minutes of Exercise per Session: 10 min  Stress: No Stress Concern Present  . Feeling of Stress : Not at all  Social Connections:   . Frequency of Communication  with Friends and Family: Not on file  . Frequency of Social Gatherings with Friends and Family: Not on file  . Attends Religious Services: Not on file  . Active Member of Clubs or Organizations: Not on file  . Attends Archivist Meetings: Not on file  . Marital Status: Not on file    Outpatient Encounter Medications as of 04/22/2019  Medication Sig  . amLODipine (NORVASC) 10 MG tablet Take 1 tablet (10 mg total) by mouth daily.  . Cholecalciferol (VITAMIN D3) 2000 units TABS Take by mouth.  . pravastatin (PRAVACHOL) 40 MG tablet TAKE 1 TABLET BY MOUTH  DAILY  . tamsulosin (FLOMAX) 0.4 MG CAPS  capsule TAKE 1 CAPSULE BY MOUTH  EVERY DAY 1/2 HOUR  FOLLOWING THE SAME MEAL  . valsartan-hydrochlorothiazide (DIOVAN-HCT) 160-25 MG tablet Take 1 tablet by mouth daily.   No facility-administered encounter medications on file as of 04/22/2019.    Activities of Daily Living In your present state of health, do you have any difficulty performing the following activities: 04/22/2019  Hearing? N  Vision? N  Difficulty concentrating or making decisions? N  Walking or climbing stairs? N  Dressing or bathing? N  Doing errands, shopping? N  Preparing Food and eating ? N  Using the Toilet? N  In the past six months, have you accidently leaked urine? N  Do you have problems with loss of bowel control? N  Managing your Medications? N  Managing your Finances? N  Some recent data might be hidden    Patient Care Team: Glendale Chard, MD as PCP - General (Internal Medicine)   Assessment:   This is a routine wellness examination for Lawrence Harrell.  Exercise Activities and Dietary recommendations Current Exercise Habits: Home exercise routine, Type of exercise: strength training/weights;stretching, Time (Minutes): 10, Frequency (Times/Week): 7, Weekly Exercise (Minutes/Week): 70  Goals    . Patient Stated     04/22/2019, no goals       Fall Risk Fall Risk  04/22/2019 01/08/2018  Falls in the past year? 0 No  Risk for fall due to : Medication side effect -  Follow up Falls evaluation completed;Education provided;Falls prevention discussed -   Is the patient's home free of loose throw rugs in walkways, pet beds, electrical cords, etc?   yes      Grab bars in the bathroom? no      Handrails on the stairs?   yes      Adequate lighting?   yes  Timed Get Up and Go Performed: n/a  Depression Screen PHQ 2/9 Scores 04/22/2019 01/08/2018  PHQ - 2 Score 0 0  PHQ- 9 Score 0 -    Cognitive Function     6CIT Screen 04/22/2019  What Year? 0 points  What month? 0 points  What time? 0 points    Count back from 20 0 points  Months in reverse 0 points  Repeat phrase 2 points  Total Score 2    Immunization History  Administered Date(s) Administered  . Zoster 03/11/2012    Qualifies for Shingles Vaccine? yes  Screening Tests Health Maintenance  Topic Date Due  . INFLUENZA VACCINE  06/09/2019 (Originally 10/10/2018)  . PNA vac Low Risk Adult (2 of 2 - PPSV23) 04/21/2020 (Originally 11/20/2012)  . TETANUS/TDAP  12/08/2021  . COLONOSCOPY  02/14/2027  . Hepatitis C Screening  Completed   Cancer Screenings: Lung: Low Dose CT Chest recommended if Age 55-80 years, 30 pack-year currently smoking OR have quit w/in 15years.  Patient does not qualify. Colorectal: up to date  Additional Screenings:  Hepatitis C Screening:06/18/2012      Plan:    Patient has no goals set at this time.  I have personally reviewed and noted the following in the patient's chart:   . Medical and social history . Use of alcohol, tobacco or illicit drugs  . Current medications and supplements . Functional ability and status . Nutritional status . Physical activity . Advanced directives . List of other physicians . Hospitalizations, surgeries, and ER visits in previous 12 months . Vitals . Screenings to include cognitive, depression, and falls . Referrals and appointments  In addition, I have reviewed and discussed with patient certain preventive protocols, quality metrics, and best practice recommendations. A written personalized care plan for preventive services as well as general preventive health recommendations were provided to patient.     Kellie Simmering, LPN  [id number]

## 2019-04-23 LAB — CBC
Hematocrit: 44.6 % (ref 37.5–51.0)
Hemoglobin: 14.2 g/dL (ref 13.0–17.7)
MCH: 26.4 pg — ABNORMAL LOW (ref 26.6–33.0)
MCHC: 31.8 g/dL (ref 31.5–35.7)
MCV: 83 fL (ref 79–97)
Platelets: 172 10*3/uL (ref 150–450)
RBC: 5.37 x10E6/uL (ref 4.14–5.80)
RDW: 13.8 % (ref 11.6–15.4)
WBC: 3.2 10*3/uL — ABNORMAL LOW (ref 3.4–10.8)

## 2019-04-23 LAB — CMP14+EGFR
ALT: 18 IU/L (ref 0–44)
AST: 22 IU/L (ref 0–40)
Albumin/Globulin Ratio: 1.3 (ref 1.2–2.2)
Albumin: 4.2 g/dL (ref 3.7–4.7)
Alkaline Phosphatase: 94 IU/L (ref 39–117)
BUN/Creatinine Ratio: 20 (ref 10–24)
BUN: 22 mg/dL (ref 8–27)
Bilirubin Total: 0.4 mg/dL (ref 0.0–1.2)
CO2: 28 mmol/L (ref 20–29)
Calcium: 9.4 mg/dL (ref 8.6–10.2)
Chloride: 98 mmol/L (ref 96–106)
Creatinine, Ser: 1.09 mg/dL (ref 0.76–1.27)
GFR calc Af Amer: 77 mL/min/{1.73_m2} (ref >59)
GFR calc non Af Amer: 67 mL/min/{1.73_m2} (ref >59)
Globulin, Total: 3.2 g/dL (ref 1.5–4.5)
Glucose: 98 mg/dL (ref 65–99)
Potassium: 3.3 mmol/L — ABNORMAL LOW (ref 3.5–5.2)
Sodium: 143 mmol/L (ref 134–144)
Total Protein: 7.4 g/dL (ref 6.0–8.5)

## 2019-04-23 LAB — LIPID PANEL
Chol/HDL Ratio: 2.9 ratio (ref 0.0–5.0)
Cholesterol, Total: 167 mg/dL (ref 100–199)
HDL: 58 mg/dL (ref >39)
LDL Chol Calc (NIH): 99 mg/dL (ref 0–99)
Triglycerides: 51 mg/dL (ref 0–149)
VLDL Cholesterol Cal: 10 mg/dL (ref 5–40)

## 2019-04-23 LAB — HEMOGLOBIN A1C
Est. average glucose Bld gHb Est-mCnc: 123 mg/dL
Hgb A1c MFr Bld: 5.9 % — ABNORMAL HIGH (ref 4.8–5.6)

## 2019-04-23 LAB — PSA: Prostate Specific Ag, Serum: 0.8 ng/mL (ref 0.0–4.0)

## 2019-04-25 NOTE — Progress Notes (Signed)
This visit occurred during the SARS-CoV-2 public health emergency.  Safety protocols were in place, including screening questions prior to the visit, additional usage of staff PPE, and extensive cleaning of exam room while observing appropriate contact time as indicated for disinfecting solutions.  Subjective:     Patient ID: Lawrence Harrell , male    DOB: [birth year] , 74 y.o.   MRN: [id number]   Chief Complaint  Patient presents with  . Annual Exam  . Hypertension  . Hyperlipidemia    HPI  He is here today for a full physical examination. He has no specific concerns at this time. He does want to do bloodwork for prostate. He is not willing to have prostate examined today.   Hypertension This is a chronic problem. The current episode started more than 1 year ago. The problem has been gradually improving since onset. The problem is controlled. Pertinent negatives include no blurred vision, chest pain, palpitations or shortness of breath. Risk factors for coronary artery disease include dyslipidemia. Past treatments include angiotensin blockers. The current treatment provides moderate improvement. Hypertensive end-organ damage includes kidney disease.  Hyperlipidemia Pertinent negatives include no chest pain or shortness of breath.     Past Medical History:  Diagnosis Date  . BPH with obstruction/lower urinary tract symptoms   . High cholesterol   . HTN (hypertension)   . Prediabetes   . Renal cell cancer, right (HCC)      Family History  Problem Relation Age of Onset  . Hypertension Mother   . Hypertension Father      Current Outpatient Medications:  .  amLODipine (NORVASC) 10 MG tablet, Take 1 tablet (10 mg total) by mouth daily., Disp: 90 tablet, Rfl: 2 .  Cholecalciferol (VITAMIN D3) 2000 units TABS, Take by mouth., Disp: , Rfl:  .  pravastatin (PRAVACHOL) 40 MG tablet, Take 1 tablet (40 mg total) by mouth daily., Disp: 90 tablet, Rfl: 3 .  valsartan-hydrochlorothiazide  (DIOVAN-HCT) 160-25 MG tablet, Take 1 tablet by mouth daily., Disp: 90 tablet, Rfl: 2   No Known Allergies   Men's preventive visit. Patient Health Questionnaire (PHQ-2) is    Clinical Support from 04/22/2019 in [facility]  PHQ-2 Total Score  0    . Patient is on a healthy diet. Marital status: Married. Relevant history for alcohol use is:  Social History   Substance and Sexual Activity  Alcohol Use Never  . Relevant history for tobacco use is:  Social History   Tobacco Use  Smoking Status Never Smoker  Smokeless Tobacco Never Used  .  Review of Systems  Constitutional: Negative.   HENT: Negative.   Eyes: Negative.  Negative for blurred vision.  Respiratory: Negative.  Negative for shortness of breath.   Cardiovascular: Negative.  Negative for chest pain and palpitations.  Endocrine: Negative.   Genitourinary: Negative.   Musculoskeletal: Negative.   Skin: Negative.   Allergic/Immunologic: Negative.   Neurological: Negative.   Hematological: Negative.   Psychiatric/Behavioral: Negative.      Today's Vitals   04/22/19 0902  BP: 132/82  Pulse: 94  Temp: 98 F (36.7 C)  TempSrc: Oral  Weight: 180 lb (81.6 kg)  Height: 5' 10.2" (1.783 m)  PainSc: 0-No pain   Body mass index is 25.68 kg/m.   Objective:  Physical Exam Vitals and nursing note reviewed.  Constitutional:      Appearance: Normal appearance.  HENT:     Head: Normocephalic and atraumatic.     Right Ear:  Tympanic membrane, ear canal and external ear normal.     Left Ear: Tympanic membrane, ear canal and external ear normal.     Nose:     Comments: Deferred, masked    Mouth/Throat:     Comments: Deferred, masked Eyes:     Extraocular Movements: Extraocular movements intact.     Conjunctiva/sclera: Conjunctivae normal.     Pupils: Pupils are equal, round, and reactive to light.  Cardiovascular:     Rate and Rhythm: Normal rate and regular rhythm.     Pulses: Normal pulses.      Heart sounds: Normal heart sounds.  Pulmonary:     Effort: Pulmonary effort is normal.     Breath sounds: Normal breath sounds.  Chest:     Breasts:        Right: Normal. No swelling, bleeding, inverted nipple, mass or nipple discharge.        Left: Normal. No swelling, bleeding, inverted nipple, mass or nipple discharge.  Abdominal:     General: Bowel sounds are normal.     Palpations: Abdomen is soft.     Hernia: A hernia is present. Hernia is present in the ventral area.     Comments: He has bilateral soft tissue masses in RUQ/LUQ. Nontender, soft. Suggestive of lipomas  Genitourinary:    Comments: Deferred, as per patient Musculoskeletal:        General: Normal range of motion.     Cervical back: Normal range of motion and neck supple.  Skin:    General: Skin is warm.  Neurological:     General: No focal deficit present.     Mental Status: He is alert.  Psychiatric:        Mood and Affect: Mood normal.        Behavior: Behavior normal.         Assessment And Plan:     1. Routine general medical examination at health care facility  A full exam was performed.  DRE deferred, as per patient. PATIENT IS ADVISED TO GET 30-45 MINUTES REGULAR EXERCISE NO LESS THAN FOUR TO FIVE DAYS PER WEEK - BOTH WEIGHTBEARING EXERCISES AND AEROBIC ARE RECOMMENDED.  HE IS ADVISED TO FOLLOW A HEALTHY DIET WITH AT LEAST SIX FRUITS/VEGGIES PER DAY, DECREASE INTAKE OF RED MEAT, AND TO INCREASE FISH INTAKE TO TWO DAYS PER WEEK.  MEATS/FISH SHOULD NOT BE FRIED, BAKED OR BROILED IS PREFERABLE.  I SUGGEST WEARING SPF 50 SUNSCREEN ON EXPOSED PARTS AND ESPECIALLY WHEN IN THE DIRECT SUNLIGHT FOR AN EXTENDED PERIOD OF TIME.  PLEASE AVOID FAST FOOD RESTAURANTS AND INCREASE YOUR WATER INTAKE.   2. Hypertensive nephropathy  Chronic, controlled. He will continue with current meds. He is encouraged to avoid adding salt to his foods. EKG performed, no new changes noted. He will rto in six months for  re-evaluation.   - EKG 12-Lead - CMP14+EGFR - CBC  3. Chronic renal disease, stage II  Chronic. I will check renal function today. He is encouraged to stay well hydrated and to keep BP less than 130/80.   4. Pure hypercholesterolemia  Chronic, yet stable. He reports compliance with medication.  He is encouraged to avoid fried foods and to continue with his walking regimen.   - Lipid panel  5. Abnormal glucose  HER A1C HAS BEEN ELEVATED IN THE PAST. I WILL CHECK AN A1C, BMET TODAY. SHE WAS ENCOURAGED TO AVOID SUGARY BEVERAGES AND PROCESSED FOODS INCLUDNG BREADS, RICE AND PASTA.  - Hemoglobin A1c  6. Nocturia  I will check PSA level today. I will consider Urology referral if elevated.   - PSA   7. Mass of soft tissue of abdomen  Sx suggestive of lipoma. No further evaluation needed at this time. He is encouraged to let me know if they grow in size. Will consider u/s of areas at that time.    Maximino Greenland, MD    THE PATIENT IS ENCOURAGED TO PRACTICE SOCIAL DISTANCING DUE TO THE COVID-19 PANDEMIC.

## 2019-04-28 ENCOUNTER — Telehealth: Payer: Self-pay

## 2019-04-28 NOTE — Telephone Encounter (Signed)
-----   Message from Glendale Chard, MD sent at 04/23/2019  8:40 AM EST ----- Your potassium is low- pls increase intake of potassium rich foods. I will mail list so you know what to incorporate into your diet. Liver and kidney fxn are stable. White blood cell count is low. This is not unusual in African-Americans. Your cholesterol is good. Continue with current meds. Your hba1c is 5.9, this is in prediabetes range. Your PSA is wnl.

## 2019-04-28 NOTE — Telephone Encounter (Signed)
Left vm for pt to return call for labs  

## 2019-05-05 ENCOUNTER — Telehealth: Payer: Self-pay

## 2019-05-05 NOTE — Telephone Encounter (Signed)
Left vm to give results

## 2019-05-05 NOTE — Telephone Encounter (Signed)
-----   Message from Glendale Chard, MD sent at 04/23/2019  8:40 AM EST ----- Your potassium is low- pls increase intake of potassium rich foods. I will mail list so you know what to incorporate into your diet. Liver and kidney fxn are stable. White blood cell count is low. This is not unusual in African-Americans. Your cholesterol is good. Continue with current meds. Your hba1c is 5.9, this is in prediabetes range. Your PSA is wnl.

## 2019-08-11 ENCOUNTER — Ambulatory Visit: Payer: Self-pay | Admitting: Internal Medicine

## 2019-09-07 ENCOUNTER — Other Ambulatory Visit: Payer: Self-pay | Admitting: Internal Medicine

## 2019-10-12 ENCOUNTER — Other Ambulatory Visit: Payer: Self-pay

## 2019-10-12 ENCOUNTER — Ambulatory Visit (INDEPENDENT_AMBULATORY_CARE_PROVIDER_SITE_OTHER): Payer: Medicare Other | Admitting: Internal Medicine

## 2019-10-12 ENCOUNTER — Encounter: Payer: Self-pay | Admitting: Internal Medicine

## 2019-10-12 VITALS — BP 142/80 | HR 77 | Temp 97.9°F | Ht 70.2 in | Wt 177.2 lb

## 2019-10-12 DIAGNOSIS — E7800 Pure hypercholesterolemia, unspecified: Secondary | ICD-10-CM | POA: Diagnosis not present

## 2019-10-12 DIAGNOSIS — N182 Chronic kidney disease, stage 2 (mild): Secondary | ICD-10-CM | POA: Diagnosis not present

## 2019-10-12 DIAGNOSIS — I129 Hypertensive chronic kidney disease with stage 1 through stage 4 chronic kidney disease, or unspecified chronic kidney disease: Secondary | ICD-10-CM

## 2019-10-12 DIAGNOSIS — E663 Overweight: Secondary | ICD-10-CM

## 2019-10-12 DIAGNOSIS — R7309 Other abnormal glucose: Secondary | ICD-10-CM

## 2019-10-12 LAB — CMP14+EGFR
ALT: 16 IU/L (ref 0–44)
AST: 17 IU/L (ref 0–40)
Albumin/Globulin Ratio: 1.3 (ref 1.2–2.2)
Albumin: 4.2 g/dL (ref 3.7–4.7)
Alkaline Phosphatase: 103 IU/L (ref 48–121)
BUN/Creatinine Ratio: 18 (ref 10–24)
BUN: 19 mg/dL (ref 8–27)
Bilirubin Total: 0.5 mg/dL (ref 0.0–1.2)
CO2: 30 mmol/L — ABNORMAL HIGH (ref 20–29)
Calcium: 9.2 mg/dL (ref 8.6–10.2)
Chloride: 99 mmol/L (ref 96–106)
Creatinine, Ser: 1.08 mg/dL (ref 0.76–1.27)
GFR calc Af Amer: 77 mL/min/{1.73_m2} (ref >59)
GFR calc non Af Amer: 67 mL/min/{1.73_m2} (ref >59)
Globulin, Total: 3.2 g/dL (ref 1.5–4.5)
Glucose: 90 mg/dL (ref 65–99)
Potassium: 3.2 mmol/L — ABNORMAL LOW (ref 3.5–5.2)
Sodium: 143 mmol/L (ref 134–144)
Total Protein: 7.4 g/dL (ref 6.0–8.5)

## 2019-10-12 LAB — HEMOGLOBIN A1C
Est. average glucose Bld gHb Est-mCnc: 123 mg/dL
Hgb A1c MFr Bld: 5.9 % — ABNORMAL HIGH (ref 4.8–5.6)

## 2019-10-12 NOTE — Progress Notes (Signed)
I,Katawbba Wiggins,acting as a Education administrator for Maximino Greenland, MD.,have documented all relevant documentation on the behalf of Maximino Greenland, MD,as directed by  Maximino Greenland, MD while in the presence of Maximino Greenland, MD.  This visit occurred during the SARS-CoV-2 public health emergency.  Safety protocols were in place, including screening questions prior to the visit, additional usage of staff PPE, and extensive cleaning of exam room while observing appropriate contact time as indicated for disinfecting solutions.  Subjective:     Patient ID: Lawrence Harrell , male    DOB: 12-24-1945 , 74 y.o.   MRN: [id number]   Chief Complaint  Patient presents with  . Hypertension  . Hyperlipidemia    HPI  The patient is here today for a follow-up on his blood pressure and cholesterol.  The patient reports compliance with his medications. He has had no issues with the medication.  Hypertension This is a chronic problem. The current episode started more than 1 year ago. The problem has been gradually improving since onset. The problem is controlled. Pertinent negatives include no blurred vision, chest pain, palpitations or shortness of breath. Risk factors for coronary artery disease include dyslipidemia and male gender. The current treatment provides moderate improvement. Hypertensive end-organ damage includes kidney disease.     Past Medical History:  Diagnosis Date  . BPH with obstruction/lower urinary tract symptoms   . High cholesterol   . HTN (hypertension)   . Prediabetes   . Renal cell cancer, right (HCC)      Family History  Problem Relation Age of Onset  . Hypertension Mother   . Hypertension Father      Current Outpatient Medications:  .  amLODipine (NORVASC) 10 MG tablet, Take 1 tablet (10 mg total) by mouth daily., Disp: 90 tablet, Rfl: 2 .  Cholecalciferol (VITAMIN D3) 2000 units TABS, Take by mouth., Disp: , Rfl:  .  pravastatin (PRAVACHOL) 40 MG tablet, Take 1 tablet (40  mg total) by mouth daily., Disp: 90 tablet, Rfl: 3 .  valsartan-hydrochlorothiazide (DIOVAN-HCT) 160-25 MG tablet, Take 1 tablet by mouth daily., Disp: 90 tablet, Rfl: 2 .  tamsulosin (FLOMAX) 0.4 MG CAPS capsule, TAKE 1 CAPSULE BY MOUTH  DAILY 1/2 HOUR FOLLOWING  THE SAME MEAL (Patient not taking: Reported on 10/11/2019), Disp: 90 capsule, Rfl: 1   No Known Allergies   Review of Systems  Constitutional: Negative.   Eyes: Negative for blurred vision.  Respiratory: Negative.  Negative for shortness of breath.   Cardiovascular: Negative.  Negative for chest pain and palpitations.  Gastrointestinal: Negative.   Neurological: Negative.   Psychiatric/Behavioral: Negative.      Today's Vitals   10/12/19 0956  BP: (!) 142/80  Pulse: 77  Temp: 97.9 F (36.6 C)  TempSrc: Oral  Weight: 177 lb 3.2 oz (80.4 kg)  Height: 5' 10.2" (1.783 m)  PainSc: 0-No pain   Body mass index is 25.28 kg/m.  Wt Readings from Last 3 Encounters:  10/12/19 177 lb 3.2 oz (80.4 kg)  04/22/19 180 lb (81.6 kg)  04/22/19 180 lb (81.6 kg)   Objective:  Physical Exam Vitals and nursing note reviewed.  Constitutional:      Appearance: Normal appearance.  HENT:     Head: Normocephalic and atraumatic.  Cardiovascular:     Rate and Rhythm: Normal rate and regular rhythm.     Heart sounds: Normal heart sounds.  Pulmonary:     Breath sounds: Normal breath sounds.  Skin:  General: Skin is warm.  Neurological:     General: No focal deficit present.     Mental Status: He is alert and oriented to person, place, and time.         Assessment And Plan:     1. Hypertensive nephropathy Comments: Chronic, fair control.  He has yet to take his meds today. HE will continue with current meds. I will check renal function.  - CMP14+EGFR  2. Chronic renal disease, stage II Comments: Chronic, I will check renal function.  He is encouraged to stay well hydrated.   3. Pure hypercholesterolemia Comments: Chronic. HE  is encouraged to avoid fried foods and to incorporate more exercise into his daily routine. Previous labs reviewed, last LDL 99 Feb 2021. I will check cholesterol at his next visit.   4. Other abnormal glucose  HIS A1C HAS BEEN ELEVATED IN THE PAST. I WILL CHECK AN A1C, BMET TODAY. HE WAS ENCOURAGED TO AVOID SUGARY BEVERAGES AND PROCESSED FOODS INCLUDNG BREADS, RICE AND PASTA.  - Hemoglobin A1c  5. Overweight with body mass index (BMI) 25.0-29.9 Comments: BMI 25. His weight is stable for his demographic.The patient was encouraged to exercise at least 150 minutes per week.   Patient was given opportunity to ask questions. Patient verbalized understanding of the plan and was able to repeat key elements of the plan. All questions were answered to their satisfaction.  Maximino Greenland, MD   I, Maximino Greenland, MD, have reviewed all documentation for this visit. The documentation on 10/17/19 for the exam, diagnosis, procedures, and orders are all accurate and complete.  THE PATIENT IS ENCOURAGED TO PRACTICE SOCIAL DISTANCING DUE TO THE COVID-19 PANDEMIC.

## 2019-10-12 NOTE — Patient Instructions (Signed)

## 2019-10-20 ENCOUNTER — Ambulatory Visit: Payer: Self-pay | Admitting: Internal Medicine

## 2020-01-19 ENCOUNTER — Other Ambulatory Visit: Payer: Self-pay | Admitting: Internal Medicine

## 2020-02-24 ENCOUNTER — Other Ambulatory Visit: Payer: Self-pay | Admitting: Internal Medicine

## 2020-04-27 ENCOUNTER — Encounter: Payer: Medicare Other | Admitting: Internal Medicine

## 2020-04-27 ENCOUNTER — Ambulatory Visit (INDEPENDENT_AMBULATORY_CARE_PROVIDER_SITE_OTHER): Payer: Medicare Other

## 2020-04-27 VITALS — Ht 72.0 in | Wt 175.0 lb

## 2020-04-27 DIAGNOSIS — Z0000 Encounter for general adult medical examination without abnormal findings: Secondary | ICD-10-CM

## 2020-04-27 NOTE — Progress Notes (Signed)
I connected with Lawrence Harrell today by telephone and verified that I am speaking with the correct person using two identifiers. Location patient: home Location provider: work Persons participating in the virtual visit: Issaiah Conkright, Glenna Durand LPN.   I discussed the limitations, risks, security and privacy concerns of performing an evaluation and management service by telephone and the availability of in person appointments. I also discussed with the patient that there may be a patient responsible charge related to this service. The patient expressed understanding and verbally consented to this telephonic visit.    Interactive audio and video telecommunications were attempted between this provider and patient, however failed, due to patient having technical difficulties OR patient did not have access to video capability.  We continued and completed visit with audio only.     Vital signs may be patient reported or missing.  Subjective:   Lawrence Harrell is a 75 y.o. male who presents for Medicare Annual/Subsequent preventive examination.  Review of Systems     Cardiac Risk Factors include: advanced age (>70men, >65 women);hypertension;male gender;sedentary lifestyle     Objective:    Today's Vitals   04/27/20 0846  Weight: 175 lb (79.4 kg)  Height: 6' (1.829 m)   Body mass index is 23.73 kg/m.  Advanced Directives 04/27/2020 04/22/2019  Does Patient Have a Medical Advance Directive? No Yes  Type of Advance Directive - [place];Living will  Copy of [place] in Chart? - No - copy requested    Current Medications (verified) Outpatient Encounter Medications as of 04/27/2020  Medication Sig  . amLODipine (NORVASC) 10 MG tablet TAKE 1 TABLET BY MOUTH  DAILY  . Cholecalciferol (VITAMIN D3) 2000 units TABS Take by mouth.  . pravastatin (PRAVACHOL) 40 MG tablet Take 1 tablet (40 mg total) by mouth daily.  . tamsulosin (FLOMAX) 0.4 MG CAPS  capsule TAKE 1 CAPSULE BY MOUTH  DAILY 1/2 HOUR FOLLOWING  THE SAME MEAL  . valsartan-hydrochlorothiazide (DIOVAN-HCT) 160-25 MG tablet TAKE 1 TABLET BY MOUTH  DAILY   No facility-administered encounter medications on file as of 04/27/2020.    Allergies (verified) Patient has no known allergies.   History: Past Medical History:  Diagnosis Date  . BPH with obstruction/lower urinary tract symptoms   . High cholesterol   . HTN (hypertension)   . Prediabetes   . Renal cell cancer, right [facility])    Past Surgical History:  Procedure Laterality Date  . gland extraction  09/2005   Salivary   . nephrectomy  10/06/2013   partial    Family History  Problem Relation Age of Onset  . Hypertension Mother   . Hypertension Father    Social History   Socioeconomic History  . Marital status: Married    Spouse name: Not on file  . Number of children: Not on file  . Years of education: Not on file  . Highest education level: Not on file  Occupational History  . Not on file  Tobacco Use  . Smoking status: Never Smoker  . Smokeless tobacco: Never Used  Vaping Use  . Vaping Use: Never used  Substance and Sexual Activity  . Alcohol use: Never  . Drug use: Never  . Sexual activity: Yes  Other Topics Concern  . Not on file  Social History Narrative  . Not on file   Social Determinants of Health   Financial Resource Strain: Low Risk   . Difficulty of Paying Living Expenses: Not hard at all  Food Insecurity: No Food Insecurity  . Worried About Charity fundraiser in the Last Year: Never true  . Ran Out of Food in the Last Year: Never true  Transportation Needs: No Transportation Needs  . Lack of Transportation (Medical): No  . Lack of Transportation (Non-Medical): No  Physical Activity: Insufficiently Active  . Days of Exercise per Week: 7 days  . Minutes of Exercise per Session: 10 min  Stress: No Stress Concern Present  . Feeling of Stress : Not at all  Social Connections: Not  on file    Tobacco Counseling Counseling given: Not Answered   Clinical Intake:  Pre-visit preparation completed: Yes  Pain : No/denies pain     Nutritional Status: BMI of 19-24  Normal Nutritional Risks: None Diabetes: No  How often do you need to have someone help you when you read instructions, pamphlets, or other written materials from your doctor or pharmacy?: 1 - Never What is the last grade level you completed in school?: 22yrs college  Diabetic? no  Interpreter Needed?: No  Information entered by :: NAllen LPN   Activities of Daily Living In your present state of health, do you have any difficulty performing the following activities: 04/27/2020  Hearing? N  Vision? N  Difficulty concentrating or making decisions? N  Walking or climbing stairs? N  Dressing or bathing? N  Doing errands, shopping? N  Preparing Food and eating ? N  Using the Toilet? N  In the past six months, have you accidently leaked urine? N  Do you have problems with loss of bowel control? N  Managing your Medications? N  Managing your Finances? N  Housekeeping or managing your Housekeeping? N  Some recent data might be hidden    Patient Care Team: Glendale Chard, MD as PCP - General (Internal Medicine)  Indicate any recent Medical Services you may have received from other than [facility] providers in the past year (date may be approximate).     Assessment:   This is a routine wellness examination for Lawrence Harrell.  Hearing/Vision screen  Hearing Screening   125Hz  250Hz  500Hz  1000Hz  2000Hz  3000Hz  4000Hz  6000Hz  8000Hz   Right ear:           Left ear:           Vision Screening Comments: Regular eye exams,   Dietary issues and exercise activities discussed: Current Exercise Habits: Home exercise routine, Type of exercise: stretching, Time (Minutes): 10, Frequency (Times/Week): 7, Weekly Exercise (Minutes/Week): 70  Goals    . Patient Stated     04/22/2019, no goals    . Patient Stated      04/27/2020, stay active and stay positive      Depression Screen PHQ 2/9 Scores 04/27/2020 04/22/2019 01/08/2018  PHQ - 2 Score 0 0 0  PHQ- 9 Score - 0 -    Fall Risk Fall Risk  04/27/2020 04/22/2019 01/08/2018  Falls in the past year? 0 0 No  Risk for fall due to : Medication side effect Medication side effect -  Follow up Falls evaluation completed;Education provided;Falls prevention discussed Falls evaluation completed;Education provided;Falls prevention discussed -    FALL RISK PREVENTION PERTAINING TO THE HOME:  Any stairs in or around the home? Yes  If so, are there any without handrails? No  Home free of loose throw rugs in walkways, pet beds, electrical cords, etc? Yes  Adequate lighting in your home to reduce risk of falls? Yes   ASSISTIVE DEVICES UTILIZED TO PREVENT  FALLS:  Life alert? No  Use of a cane, walker or w/c? No  Grab bars in the bathroom? Yes  Shower chair or bench in shower? Yes  Elevated toilet seat or a handicapped toilet? Yes   TIMED UP AND GO:  Was the test performed? No   Cognitive Function:     6CIT Screen 04/27/2020 04/22/2019  What Year? 0 points 0 points  What month? 0 points 0 points  What time? 0 points 0 points  Count back from 20 0 points 0 points  Months in reverse 0 points 0 points  Repeat phrase 6 points 2 points  Total Score 6 2    Immunizations Immunization History  Administered Date(s) Administered  . PFIZER(Purple Top)SARS-COV-2 Vaccination 06/03/2019, 06/29/2019, 12/28/2019  . Zoster 03/11/2012    TDAP status: Up to date  Flu Vaccine status: Declined, Education has been provided regarding the importance of this vaccine but patient still declined. Advised may receive this vaccine at local pharmacy or Health Dept. Aware to provide a copy of the vaccination record if obtained from local pharmacy or Health Dept. Verbalized acceptance and understanding.  Pneumococcal vaccine status: Due, Education has been provided regarding  the importance of this vaccine. Advised may receive this vaccine at local pharmacy or Health Dept. Aware to provide a copy of the vaccination record if obtained from local pharmacy or Health Dept. Verbalized acceptance and understanding.  Covid-19 vaccine status: Completed vaccines  Qualifies for Shingles Vaccine? Yes   Zostavax completed Yes   Shingrix Completed?: No.    Education has been provided regarding the importance of this vaccine. Patient has been advised to call insurance company to determine out of pocket expense if they have not yet received this vaccine. Advised may also receive vaccine at local pharmacy or Health Dept. Verbalized acceptance and understanding.  Screening Tests Health Maintenance  Topic Date Due  . PNA vac Low Risk Adult (2 of 2 - PPSV23) 11/20/2012  . INFLUENZA VACCINE  06/08/2020 (Originally 10/10/2019)  . COVID-19 Vaccine (4 - Booster for Pfizer series) 06/27/2020  . TETANUS/TDAP  12/08/2021  . COLONOSCOPY (Pts 45-78yrs Insurance coverage will need to be confirmed)  02/14/2027  . Hepatitis C Screening  Completed    Health Maintenance  Health Maintenance Due  Topic Date Due  . PNA vac Low Risk Adult (2 of 2 - PPSV23) 11/20/2012    Colorectal cancer screening: No longer required.   Lung Cancer Screening: (Low Dose CT Chest recommended if Age 28-80 years, 30 pack-year currently smoking OR have quit w/in 15years.) does not qualify.   Lung Cancer Screening Referral: no  Additional Screening:  Hepatitis C Screening: does qualify; Completed 06/18/2012   Vision Screening: Recommended annual ophthalmology exams for early detection of glaucoma and other disorders of the eye. Is the patient up to date with their annual eye exam?  Yes  Who is the provider or what is the name of the office in which the patient attends annual eye exams? Can't remember name If pt is not established with a provider, would they like to be referred to a provider to establish care?  No .   Dental Screening: Recommended annual dental exams for proper oral hygiene  Community Resource Referral / Chronic Care Management: CRR required this visit?  No   CCM required this visit?  No      Plan:     I have personally reviewed and noted the following in the patient's chart:   . Medical and social  history . Use of alcohol, tobacco or illicit drugs  . Current medications and supplements . Functional ability and status . Nutritional status . Physical activity . Advanced directives . List of other physicians . Hospitalizations, surgeries, and ER visits in previous 12 months . Vitals . Screenings to include cognitive, depression, and falls . Referrals and appointments  In addition, I have reviewed and discussed with patient certain preventive protocols, quality metrics, and best practice recommendations. A written personalized care plan for preventive services as well as general preventive health recommendations were provided to patient.     Kellie Simmering, LPN   [id number]   Nurse Notes:

## 2020-04-27 NOTE — Patient Instructions (Signed)
Lawrence Harrell , Thank you for taking time to come for your Medicare Wellness Visit. I appreciate your ongoing commitment to your health goals. Please review the following plan we discussed and let me know if I can assist you in the future.   Screening recommendations/referrals: Colonoscopy: not required Recommended yearly ophthalmology/optometry visit for glaucoma screening and checkup Recommended yearly dental visit for hygiene and checkup  Vaccinations: Influenza vaccine: decline Pneumococcal vaccine: due Tdap vaccine: completed 12/09/2011, due 12/08/2021 Shingles vaccine: discussed   Covid-19:  12/28/2019, 06/29/2019, 06/03/2019  Advanced directives: Advance directive discussed with you today.  Conditions/risks identified: none  Next appointment: Follow up in one year for your annual wellness visit.   Preventive Care 76 Years and Older, Male Preventive care refers to lifestyle choices and visits with your health care provider that can promote health and wellness. What does preventive care include?  A yearly physical exam. This is also called an annual well check.  Dental exams once or twice a year.  Routine eye exams. Ask your health care provider how often you should have your eyes checked.  Personal lifestyle choices, including:  Daily care of your teeth and gums.  Regular physical activity.  Eating a healthy diet.  Avoiding tobacco and drug use.  Limiting alcohol use.  Practicing safe sex.  Taking low doses of aspirin every day.  Taking vitamin and mineral supplements as recommended by your health care provider. What happens during an annual well check? The services and screenings done by your health care provider during your annual well check will depend on your age, overall health, lifestyle risk factors, and family history of disease. Counseling  Your health care provider may ask you questions about your:  Alcohol use.  Tobacco use.  Drug use.  Emotional  well-being.  Home and relationship well-being.  Sexual activity.  Eating habits.  History of falls.  Memory and ability to understand (cognition).  Work and work Statistician. Screening  You may have the following tests or measurements:  Height, weight, and BMI.  Blood pressure.  Lipid and cholesterol levels. These may be checked every 5 years, or more frequently if you are over 66 years old.  Skin check.  Lung cancer screening. You may have this screening every year starting at age 45 if you have a 30-pack-year history of smoking and currently smoke or have quit within the past 15 years.  Fecal occult blood test (FOBT) of the stool. You may have this test every year starting at age 24.  Flexible sigmoidoscopy or colonoscopy. You may have a sigmoidoscopy every 5 years or a colonoscopy every 10 years starting at age 76.  Prostate cancer screening. Recommendations will vary depending on your family history and other risks.  Hepatitis C blood test.  Hepatitis B blood test.  Sexually transmitted disease (STD) testing.  Diabetes screening. This is done by checking your blood sugar (glucose) after you have not eaten for a while (fasting). You may have this done every 1-3 years.  Abdominal aortic aneurysm (AAA) screening. You may need this if you are a current or former smoker.  Osteoporosis. You may be screened starting at age 58 if you are at high risk. Talk with your health care provider about your test results, treatment options, and if necessary, the need for more tests. Vaccines  Your health care provider may recommend certain vaccines, such as:  Influenza vaccine. This is recommended every year.  Tetanus, diphtheria, and acellular pertussis (Tdap, Td) vaccine. You may need  a Td booster every 10 years.  Zoster vaccine. You may need this after age 53.  Pneumococcal 13-valent conjugate (PCV13) vaccine. One dose is recommended after age 68.  Pneumococcal  polysaccharide (PPSV23) vaccine. One dose is recommended after age 20. Talk to your health care provider about which screenings and vaccines you need and how often you need them. This information is not intended to replace advice given to you by your health care provider. Make sure you discuss any questions you have with your health care provider. Document Released: 03/24/2015 Document Revised: 11/15/2015 Document Reviewed: 12/27/2014 Elsevier Interactive Patient Education  2017 [employer] Prevention in the Home Falls can cause injuries. They can happen to people of all ages. There are many things you can do to make your home safe and to help prevent falls. What can I do on the outside of my home?  Regularly fix the edges of walkways and driveways and fix any cracks.  Remove anything that might make you trip as you walk through a door, such as a raised step or threshold.  Trim any bushes or trees on the path to your home.  Use bright outdoor lighting.  Clear any walking paths of anything that might make someone trip, such as rocks or tools.  Regularly check to see if handrails are loose or broken. Make sure that both sides of any steps have handrails.  Any raised decks and porches should have guardrails on the edges.  Have any leaves, snow, or ice cleared regularly.  Use sand or salt on walking paths during winter.  Clean up any spills in your garage right away. This includes oil or grease spills. What can I do in the bathroom?  Use night lights.  Install grab bars by the toilet and in the tub and shower. Do not use towel bars as grab bars.  Use non-skid mats or decals in the tub or shower.  If you need to sit down in the shower, use a plastic, non-slip stool.  Keep the floor dry. Clean up any water that spills on the floor as soon as it happens.  Remove soap buildup in the tub or shower regularly.  Attach bath mats securely with double-sided non-slip rug  tape.  Do not have throw rugs and other things on the floor that can make you trip. What can I do in the bedroom?  Use night lights.  Make sure that you have a light by your bed that is easy to reach.  Do not use any sheets or blankets that are too big for your bed. They should not hang down onto the floor.  Have a firm chair that has side arms. You can use this for support while you get dressed.  Do not have throw rugs and other things on the floor that can make you trip. What can I do in the kitchen?  Clean up any spills right away.  Avoid walking on wet floors.  Keep items that you use a lot in easy-to-reach places.  If you need to reach something above you, use a strong step stool that has a grab bar.  Keep electrical cords out of the way.  Do not use floor polish or wax that makes floors slippery. If you must use wax, use non-skid floor wax.  Do not have throw rugs and other things on the floor that can make you trip. What can I do with my stairs?  Do not leave any items on the  stairs.  Make sure that there are handrails on both sides of the stairs and use them. Fix handrails that are broken or loose. Make sure that handrails are as long as the stairways.  Check any carpeting to make sure that it is firmly attached to the stairs. Fix any carpet that is loose or worn.  Avoid having throw rugs at the top or bottom of the stairs. If you do have throw rugs, attach them to the floor with carpet tape.  Make sure that you have a light switch at the top of the stairs and the bottom of the stairs. If you do not have them, ask someone to add them for you. What else can I do to help prevent falls?  Wear shoes that:  Do not have high heels.  Have rubber bottoms.  Are comfortable and fit you well.  Are closed at the toe. Do not wear sandals.  If you use a stepladder:  Make sure that it is fully opened. Do not climb a closed stepladder.  Make sure that both sides of the  stepladder are locked into place.  Ask someone to hold it for you, if possible.  Clearly mark and make sure that you can see:  Any grab bars or handrails.  First and last steps.  Where the edge of each step is.  Use tools that help you move around (mobility aids) if they are needed. These include:  Canes.  Walkers.  Scooters.  Crutches.  Turn on the lights when you go into a dark area. Replace any light bulbs as soon as they burn out.  Set up your furniture so you have a clear path. Avoid moving your furniture around.  If any of your floors are uneven, fix them.  If there are any pets around you, be aware of where they are.  Review your medicines with your doctor. Some medicines can make you feel dizzy. This can increase your chance of falling. Ask your doctor what other things that you can do to help prevent falls. This information is not intended to replace advice given to you by your health care provider. Make sure you discuss any questions you have with your health care provider. Document Released: 12/22/2008 Document Revised: 08/03/2015 Document Reviewed: 04/01/2014 Elsevier Interactive Patient Education  2017 [employer].

## 2020-05-01 ENCOUNTER — Telehealth: Payer: Self-pay

## 2020-05-01 NOTE — Telephone Encounter (Signed)
Called patient wife back because she wanted to reschedule his physical because she states he can not work afternoon explain to patient wife that dr.sanders does not have any morning appointments available for 2022 but if they wanted morning I could schedule him with NP she said "NO we don't see no NP." I explained to her that he would need to be seen before the end of the year because he is on medications that need to be monitored and she states to cancel the appt and to ask Dr.Sanders

## 2020-05-02 ENCOUNTER — Other Ambulatory Visit: Payer: Self-pay | Admitting: Internal Medicine

## 2020-05-22 ENCOUNTER — Ambulatory Visit (INDEPENDENT_AMBULATORY_CARE_PROVIDER_SITE_OTHER): Payer: Medicare Other | Admitting: Internal Medicine

## 2020-05-22 ENCOUNTER — Other Ambulatory Visit: Payer: Self-pay

## 2020-05-22 ENCOUNTER — Encounter: Payer: Self-pay | Admitting: Internal Medicine

## 2020-05-22 VITALS — BP 144/70 | HR 108 | Temp 98.0°F | Ht 72.0 in | Wt 169.0 lb

## 2020-05-22 DIAGNOSIS — E7800 Pure hypercholesterolemia, unspecified: Secondary | ICD-10-CM

## 2020-05-22 DIAGNOSIS — I129 Hypertensive chronic kidney disease with stage 1 through stage 4 chronic kidney disease, or unspecified chronic kidney disease: Secondary | ICD-10-CM

## 2020-05-22 DIAGNOSIS — N182 Chronic kidney disease, stage 2 (mild): Secondary | ICD-10-CM | POA: Diagnosis not present

## 2020-05-22 DIAGNOSIS — R7309 Other abnormal glucose: Secondary | ICD-10-CM | POA: Diagnosis not present

## 2020-05-22 DIAGNOSIS — Z23 Encounter for immunization: Secondary | ICD-10-CM | POA: Diagnosis not present

## 2020-05-22 MED ORDER — PREVNAR 20 0.5 ML IM SUSY: 0.5000 mL | PREFILLED_SYRINGE | INTRAMUSCULAR | 0 refills | Status: AC

## 2020-05-22 NOTE — Patient Instructions (Signed)
High Cholesterol  High cholesterol is a condition in which the blood has high levels of a white, waxy substance similar to fat (cholesterol). The liver makes all the cholesterol that the body needs. The human body needs small amounts of cholesterol to help build cells. A person gets extra or excess cholesterol from the food that he or she eats. The blood carries cholesterol from the liver to the rest of the body. If you have high cholesterol, deposits (plaques) may build up on the walls of your arteries. Arteries are the blood vessels that carry blood away from your heart. These plaques make the arteries narrow and stiff. Cholesterol plaques increase your risk for heart attack and stroke. Work with your health care provider to keep your cholesterol levels in a healthy range. What increases the risk? The following factors may make you more likely to develop this condition:  Eating foods that are high in animal fat (saturated fat) or cholesterol.  Being overweight.  Not getting enough exercise.  A family history of high cholesterol (familial hypercholesterolemia).  Use of tobacco products.  Having diabetes. What are the signs or symptoms? There are no symptoms of this condition. How is this diagnosed? This condition may be diagnosed based on the results of a blood test.  If you are older than 76 years of age, your health care provider may check your cholesterol levels every 4-6 years.  You may be checked more often if you have high cholesterol or other risk factors for heart disease. The blood test for cholesterol measures:  "Bad" cholesterol, or LDL cholesterol. This is the main type of cholesterol that causes heart disease. The desired level is less than 100 mg/dL.  "Good" cholesterol, or HDL cholesterol. HDL helps protect against heart disease by cleaning the arteries and carrying the LDL to the liver for processing. The desired level for HDL is 60 mg/dL or higher.  Triglycerides.  These are fats that your body can store or burn for energy. The desired level is less than 150 mg/dL.  Total cholesterol. This measures the total amount of cholesterol in your blood and includes LDL, HDL, and triglycerides. The desired level is less than 200 mg/dL. How is this treated? This condition may be treated with:  Diet changes. You may be asked to eat foods that have more fiber and less saturated fats or added sugar.  Lifestyle changes. These may include regular exercise, maintaining a healthy weight, and quitting use of tobacco products.  Medicines. These are given when diet and lifestyle changes have not worked. You may be prescribed a statin medicine to help lower your cholesterol levels. Follow these instructions at home: Eating and drinking  Eat a healthy, balanced diet. This diet includes: ? Daily servings of a variety of fresh, frozen, or canned fruits and vegetables. ? Daily servings of whole grain foods that are rich in fiber. ? Foods that are low in saturated fats and trans fats. These include poultry and fish without skin, lean cuts of meat, and low-fat dairy products. ? A variety of fish, especially oily fish that contain omega-3 fatty acids. Aim to eat fish at least 2 times a week.  Avoid foods and drinks that have added sugar.  Use healthy cooking methods, such as roasting, grilling, broiling, baking, poaching, steaming, and stir-frying. Do not fry your food except for stir-frying.   Lifestyle  Get regular exercise. Aim to exercise for a total of 150 minutes a week. Increase your activity level by doing activities   such as gardening, walking, and taking the stairs.  Do not use any products that contain nicotine or tobacco, such as cigarettes, e-cigarettes, and chewing tobacco. If you need help quitting, ask your health care provider.   General instructions  Take over-the-counter and prescription medicines only as told by your health care provider.  Keep all  follow-up visits as told by your health care provider. This is important. Where to find more information  American Heart Association: www.heart.org  National Heart, Lung, and Blood Institute: www.nhlbi.nih.gov Contact a health care provider if:  You have trouble achieving or maintaining a healthy diet or weight.  You are starting an exercise program.  You are unable to stop smoking. Get help right away if:  You have chest pain.  You have trouble breathing.  You have any symptoms of a stroke. "BE FAST" is an easy way to remember the main warning signs of a stroke: ? B - Balance. Signs are dizziness, sudden trouble walking, or loss of balance. ? E - Eyes. Signs are trouble seeing or a sudden change in vision. ? F - Face. Signs are sudden weakness or numbness of the face, or the face or eyelid drooping on one side. ? A - Arms. Signs are weakness or numbness in an arm. This happens suddenly and usually on one side of the body. ? S - Speech. Signs are sudden trouble speaking, slurred speech, or trouble understanding what people say. ? T - Time. Time to call emergency services. Write down what time symptoms started.  You have other signs of a stroke, such as: ? A sudden, severe headache with no known cause. ? Nausea or vomiting. ? Seizure. These symptoms may represent a serious problem that is an emergency. Do not wait to see if the symptoms will go away. Get medical help right away. Call your local emergency services (911 in the [place] Do not drive yourself to the hospital. Summary  Cholesterol plaques increase your risk for heart attack and stroke. Work with your health care provider to keep your cholesterol levels in a healthy range.  Eat a healthy, balanced diet, get regular exercise, and maintain a healthy weight.  Do not use any products that contain nicotine or tobacco, such as cigarettes, e-cigarettes, and chewing tobacco.  Get help right away if you have any symptoms of a  stroke. This information is not intended to replace advice given to you by your health care provider. Make sure you discuss any questions you have with your health care provider. Document Revised: 01/25/2019 Document Reviewed: 01/25/2019 Elsevier Patient Education  2021 Elsevier Inc.  
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
## 2020-05-23 LAB — CMP14+EGFR
ALT: 34 IU/L (ref 0–44)
AST: 27 IU/L (ref 0–40)
Albumin/Globulin Ratio: 1.3 (ref 1.2–2.2)
Albumin: 4 g/dL (ref 3.7–4.7)
Alkaline Phosphatase: 105 IU/L (ref 44–121)
BUN/Creatinine Ratio: 20 (ref 10–24)
BUN: 21 mg/dL (ref 8–27)
Bilirubin Total: 0.5 mg/dL (ref 0.0–1.2)
CO2: 28 mmol/L (ref 20–29)
Calcium: 9.7 mg/dL (ref 8.6–10.2)
Chloride: 101 mmol/L (ref 96–106)
Creatinine, Ser: 1.05 mg/dL (ref 0.76–1.27)
Globulin, Total: 3.2 g/dL (ref 1.5–4.5)
Glucose: 89 mg/dL (ref 65–99)
Potassium: 3.7 mmol/L (ref 3.5–5.2)
Sodium: 145 mmol/L — ABNORMAL HIGH (ref 134–144)
Total Protein: 7.2 g/dL (ref 6.0–8.5)
eGFR: 74 mL/min/{1.73_m2} (ref >59)

## 2020-05-23 LAB — CBC
Hematocrit: 41.7 % (ref 37.5–51.0)
Hemoglobin: 13.3 g/dL (ref 13.0–17.7)
MCH: 25.4 pg — ABNORMAL LOW (ref 26.6–33.0)
MCHC: 31.9 g/dL (ref 31.5–35.7)
MCV: 80 fL (ref 79–97)
Platelets: 167 10*3/uL (ref 150–450)
RBC: 5.24 x10E6/uL (ref 4.14–5.80)
RDW: 13 % (ref 11.6–15.4)
WBC: 3.7 10*3/uL (ref 3.4–10.8)

## 2020-05-23 LAB — LIPID PANEL
Chol/HDL Ratio: 2.9 ratio (ref 0.0–5.0)
Cholesterol, Total: 134 mg/dL (ref 100–199)
HDL: 47 mg/dL (ref >39)
LDL Chol Calc (NIH): 77 mg/dL (ref 0–99)
Triglycerides: 44 mg/dL (ref 0–149)
VLDL Cholesterol Cal: 10 mg/dL (ref 5–40)

## 2020-05-23 LAB — HEMOGLOBIN A1C
Est. average glucose Bld gHb Est-mCnc: 126 mg/dL
Hgb A1c MFr Bld: 6 % — ABNORMAL HIGH (ref 4.8–5.6)

## 2020-07-10 ENCOUNTER — Encounter: Payer: Medicare Other | Admitting: Internal Medicine

## 2020-11-21 ENCOUNTER — Other Ambulatory Visit: Payer: Self-pay

## 2020-11-21 ENCOUNTER — Encounter: Payer: Medicare Other | Admitting: Internal Medicine

## 2020-11-21 ENCOUNTER — Telehealth (INDEPENDENT_AMBULATORY_CARE_PROVIDER_SITE_OTHER): Payer: Medicare Other | Admitting: Nurse Practitioner

## 2020-11-21 ENCOUNTER — Encounter: Payer: Self-pay | Admitting: Nurse Practitioner

## 2020-11-21 VITALS — BP 112/55 | HR 109 | Ht 72.0 in

## 2020-11-21 DIAGNOSIS — U071 COVID-19: Secondary | ICD-10-CM | POA: Diagnosis not present

## 2020-11-21 DIAGNOSIS — R197 Diarrhea, unspecified: Secondary | ICD-10-CM | POA: Diagnosis not present

## 2020-11-21 DIAGNOSIS — Z85528 Personal history of other malignant neoplasm of kidney: Secondary | ICD-10-CM

## 2020-11-21 DIAGNOSIS — R059 Cough, unspecified: Secondary | ICD-10-CM | POA: Diagnosis not present

## 2020-11-21 MED ORDER — PROMETHAZINE-DM 6.25-15 MG/5ML PO SYRP: 5.0000 mL | ORAL_SOLUTION | Freq: Four times a day (QID) | ORAL | 0 refills | Status: DC | PRN

## 2020-11-21 NOTE — Progress Notes (Signed)
Virtual Visit via [place] as a scribe for [name], FNP.,have documented all relevant documentation on the behalf of [name], FNP,as directed by  [name], FNP while in the presence of [name], [name].   This visit type was conducted due to national recommendations for restrictions regarding the COVID-19 Pandemic (e.g. social distancing) in an effort to limit this patient's exposure and mitigate transmission in our community.  Due to his co-morbid illnesses, this patient is at least at moderate risk for complications without adequate follow up.  This format is felt to be most appropriate for this patient at this time.  All issues noted in this document were discussed and addressed.  A limited physical exam was performed with this format.    This visit type was conducted due to national recommendations for restrictions regarding the COVID-19 Pandemic (e.g. social distancing) in an effort to limit this patient's exposure and mitigate transmission in our community.  Patients identity confirmed using two different identifiers.  This format is felt to be most appropriate for this patient at this time.  All issues noted in this document were discussed and addressed.  No physical exam was performed (except for noted visual exam findings with Video Visits).    Date:  11/21/2020   ID:  Lawrence Harrell, Lawrence Harrell 1946-01-22, MRN [id number]  Patient Location:  Home - spoke with [name]  Provider location:   Office    Chief Complaint:  positive for covid  History of Present Illness:    Lawrence Harrell is a 75 y.o. male who presents via video conferencing for a telehealth visit today.    The patient does have symptoms concerning for COVID-19 infection (fever, chills, cough, or new shortness of breath).   Pt presents today experiencing high temp & diarrhea. At home Covid test was positive. He had covid vaccine with 1 booster.  Last night patients temp  read 99.4, he has not taken temperature since. Cough and congestion. Denies shortness of breath. He is able to taste and eat. Renal cell cancer right 2015.     Past Medical History:  Diagnosis Date   BPH with obstruction/lower urinary tract symptoms    High cholesterol    HTN (hypertension)    Prediabetes    Renal cell cancer, right [facility])    Past Surgical History:  Procedure Laterality Date   gland extraction  09/2005   Salivary    nephrectomy  10/06/2013   partial      Current Meds  Medication Sig   amLODipine (NORVASC) 10 MG tablet TAKE 1 TABLET BY MOUTH  DAILY   Cholecalciferol (VITAMIN D3) 2000 units TABS Take by mouth.   pravastatin (PRAVACHOL) 40 MG tablet TAKE 1 TABLET BY MOUTH  DAILY   promethazine-dextromethorphan (PROMETHAZINE-DM) 6.25-15 MG/5ML syrup Take 5 mLs by mouth 4 (four) times daily as needed for cough.   tamsulosin (FLOMAX) 0.4 MG CAPS capsule TAKE 1 CAPSULE BY MOUTH  DAILY 1/2 HOUR FOLLOWING  THE SAME MEAL   valsartan-hydrochlorothiazide (DIOVAN-HCT) 160-25 MG tablet TAKE 1 TABLET BY MOUTH  DAILY     Allergies:   Patient has no known allergies.   Social History   Tobacco Use   Smoking status: Never   Smokeless tobacco: Never  Vaping Use   Vaping Use: Never used  Substance Use Topics   Alcohol use: Never   Drug use: Never     Family Hx: The patient's family history includes Hypertension in his father and  mother.  ROS:   Please see the history of present illness.    Review of Systems  Constitutional:  Positive for fever and malaise/fatigue.  HENT: Negative.    Respiratory:  Positive for cough. Negative for sputum production and shortness of breath.   Cardiovascular: Negative.  Negative for chest pain.  Gastrointestinal:  Positive for diarrhea.  Musculoskeletal: Negative.   Skin: Negative.   Neurological: Negative.  Negative for dizziness and headaches.  Endo/Heme/Allergies: Negative.   Psychiatric/Behavioral: Negative.     All other  systems reviewed and are negative.   Labs/Other Tests and Data Reviewed:    Recent Labs: 05/22/2020: ALT 34; BUN 21; Creatinine, Ser 1.05; Hemoglobin 13.3; Platelets 167; Potassium 3.7; Sodium 145   Recent Lipid Panel Lab Results  Component Value Date/Time   CHOL 134 05/22/2020 12:31 PM   TRIG 44 05/22/2020 12:31 PM   HDL 47 05/22/2020 12:31 PM   CHOLHDL 2.9 05/22/2020 12:31 PM   LDLCALC 77 05/22/2020 12:31 PM    Wt Readings from Last 3 Encounters:  05/22/20 169 lb (76.7 kg)  04/27/20 175 lb (79.4 kg)  10/12/19 177 lb 3.2 oz (80.4 kg)     Exam:    Vital Signs:  BP (!) 112/55   Pulse (!) 109   Ht 6' (1.829 m)   BMI 22.92 kg/m     Physical Exam Constitutional:      General: He is not in acute distress.    Appearance: Normal appearance.     Comments: Can tell he does not feel well  Pulmonary:     Effort: Pulmonary effort is normal. No respiratory distress.  Neurological:     General: No focal deficit present.     Mental Status: He is alert and oriented to person, place, and time. Mental status is at baseline.     Cranial Nerves: No cranial nerve deficit.  Psychiatric:        Mood and Affect: Mood and affect normal.        Behavior: Behavior normal.        Thought Content: Thought content normal.        Cognition and Memory: Memory normal.        Judgment: Judgment normal.    ASSESSMENT & PLAN:    1. COVID-19 Will refer for antibody infusion due to chronic health history  Advised patient to take Vitamin C, D, Zinc.  Keep yourself hydrated with a lot of water and rest. Take Delsym for cough and Mucinex as need. Take Tylenol or pain reliever every 4-6 hours as needed for pain/fever/body ache. If you have elevated blood pressure, you can take OTC Corcidin.  Educated patient if symptoms get worse or if she experiences any SOB, chest pain or pain in her legs to seek immediate emergency care. Continue to monitor your oxygen levels. Call us if you have any questions.  Quarantine for 5 days if tested positive and no symptoms or 10 days if tested positive and have symptoms. Wear a mask around other people.  - promethazine-dextromethorphan (PROMETHAZINE-DM) 6.25-15 MG/5ML syrup; Take 5 mLs by mouth 4 (four) times daily as needed for cough.  Dispense: 118 mL; Refill: 0 - bebtelovimab EUA injection SOLN 175 mg - 0.9 %  sodium chloride infusion - Hypersensitivity GRADE 1: Transient flushing or rash, or drug fever < 100.4 F - Hypersensitivity GRADE 2: Rash, flushing, urticaria, dyspnea, or drug fever = or > 100.4 F - Hypersensitivity GRADE 3: symptomatic bronchospasm, with or without urticaria, parenteral  medication management indicated, allergy-related edema/angioedema, or hypotension - Hypersensitivity GRADE 4: Anaphylaxis - diphenhydrAMINE (BENADRYL) injection 50 mg - famotidine (PEPCID) 20 mg in sodium chloride 0.9 % 50 mL IVPB - methylPREDNISolone sodium succinate (SOLU-MEDROL) 130 mg in sodium chloride 0.9 % 50 mL IVPB - albuterol (VENTOLIN HFA) 108 (90 Base) MCG/ACT inhaler 2 puff - EPINEPHrine (EPI-PEN) injection 0.3 mg  2. Cough Rx for cough syrup sent to pharmacy  - promethazine-dextromethorphan (PROMETHAZINE-DM) 6.25-15 MG/5ML syrup; Take 5 mLs by mouth 4 (four) times daily as needed for cough.  Dispense: 118 mL; Refill: 0  3. Diarrhea, unspecified type Eat bland diet and stay well hydrated with gatorade or vitamin water If worsens need to call to office.   4. History of renal cell cancer     COVID-19 Education: The signs and symptoms of COVID-19 were discussed with the patient and how to seek care for testing (follow up with PCP or arrange E-visit).  The importance of social distancing was discussed today.  Patient Risk:   After full review of this patients clinical status, I feel that they are at least moderate risk at this time.  Time:   Today, I have spent 8.30 minutes/ seconds with the patient with telehealth technology discussing above  diagnoses.     Medication Adjustments/Labs and Tests Ordered: Current medicines are reviewed at length with the patient today.  Concerns regarding medicines are outlined above.   Tests Ordered: No orders of the defined types were placed in this encounter.   Medication Changes: Meds ordered this encounter  Medications   promethazine-dextromethorphan (PROMETHAZINE-DM) 6.25-15 MG/5ML syrup    Sig: Take 5 mLs by mouth 4 (four) times daily as needed for cough.    Dispense:  118 mL    Refill:  0     Disposition:  Follow up prn  Signed, [name], FNP

## 2020-11-21 NOTE — Patient Instructions (Signed)

## 2020-11-22 ENCOUNTER — Ambulatory Visit (INDEPENDENT_AMBULATORY_CARE_PROVIDER_SITE_OTHER): Payer: Medicare Other

## 2020-11-22 DIAGNOSIS — U071 COVID-19: Secondary | ICD-10-CM

## 2020-11-22 MED ORDER — DIPHENHYDRAMINE HCL 50 MG/ML IJ SOLN: 50.0000 mg | Freq: Once | INTRAMUSCULAR | Status: AC | PRN

## 2020-11-22 MED ORDER — SODIUM CHLORIDE 0.9 % IV SOLN: INTRAVENOUS | Status: DC | PRN

## 2020-11-22 MED ORDER — EPINEPHRINE 0.3 MG/0.3ML IJ SOAJ: 0.3000 mg | Freq: Once | INTRAMUSCULAR | Status: AC | PRN

## 2020-11-22 MED ORDER — FAMOTIDINE IN NACL 20-0.9 MG/50ML-% IV SOLN: 20.0000 mg | Freq: Once | INTRAVENOUS | Status: AC | PRN

## 2020-11-22 MED ORDER — ALBUTEROL SULFATE HFA 108 (90 BASE) MCG/ACT IN AERS: 2.0000 | INHALATION_SPRAY | Freq: Once | RESPIRATORY_TRACT | Status: AC | PRN

## 2020-11-22 MED ORDER — BEBTELOVIMAB 175 MG/2 ML IV (EUA)
175.0000 mg | Freq: Once | INTRAMUSCULAR | Status: AC
  Administered 2020-11-22: 175 mg via INTRAVENOUS

## 2020-11-22 MED ORDER — METHYLPREDNISOLONE SODIUM SUCC 125 MG IJ SOLR
125.0000 mg | Freq: Once | INTRAMUSCULAR | Status: AC | PRN
Start: 2020-11-22 — End: 2020-11-22

## 2020-11-22 NOTE — Progress Notes (Signed)
Diagnosis: COVID  Provider:  Marshell Garfinkel, MD  Procedure: Infusion  IV Type: Peripheral, IV Location: R Hand  Bebtelovimab, Dose: 175 mg  Infusion Start Time: 15.43 11/22/2020  Infusion Stop Time: 15.44 11/22/2020  Post Infusion IV Care: 30 minutes Observation period completed and Peripheral IV Discontinued  Discharge: Condition: Good, Destination: Home . AVS provided to patient.   Performed by:  Arnoldo Morale, RN

## 2020-12-19 ENCOUNTER — Ambulatory Visit (INDEPENDENT_AMBULATORY_CARE_PROVIDER_SITE_OTHER): Payer: Medicare Other | Admitting: Internal Medicine

## 2020-12-19 ENCOUNTER — Encounter: Payer: Self-pay | Admitting: Internal Medicine

## 2020-12-19 ENCOUNTER — Other Ambulatory Visit: Payer: Self-pay

## 2020-12-19 VITALS — BP 138/72 | HR 98 | Temp 98.1°F | Ht 72.0 in | Wt 149.6 lb

## 2020-12-19 DIAGNOSIS — E7800 Pure hypercholesterolemia, unspecified: Secondary | ICD-10-CM | POA: Insufficient documentation

## 2020-12-19 DIAGNOSIS — R7309 Other abnormal glucose: Secondary | ICD-10-CM | POA: Diagnosis not present

## 2020-12-19 DIAGNOSIS — N182 Chronic kidney disease, stage 2 (mild): Secondary | ICD-10-CM

## 2020-12-19 DIAGNOSIS — I129 Hypertensive chronic kidney disease with stage 1 through stage 4 chronic kidney disease, or unspecified chronic kidney disease: Secondary | ICD-10-CM | POA: Diagnosis not present

## 2020-12-19 DIAGNOSIS — R634 Abnormal weight loss: Secondary | ICD-10-CM | POA: Insufficient documentation

## 2020-12-19 DIAGNOSIS — Z8616 Personal history of COVID-19: Secondary | ICD-10-CM

## 2020-12-19 LAB — POCT URINALYSIS DIPSTICK
Bilirubin, UA: NEGATIVE
Blood, UA: NEGATIVE
Glucose, UA: NEGATIVE
Ketones, UA: NEGATIVE
Leukocytes, UA: NEGATIVE
Nitrite, UA: NEGATIVE
Protein, UA: NEGATIVE
Spec Grav, UA: 1.025 (ref 1.010–1.025)
Urobilinogen, UA: 0.2 E.U./dL
pH, UA: 7 (ref 5.0–8.0)

## 2020-12-19 NOTE — Progress Notes (Signed)
I,Tianna Badgett,acting as a Education administrator for Maximino Greenland, MD.,have documented all relevant documentation on the behalf of Maximino Greenland, MD,as directed by  Maximino Greenland, MD while in the presence of Maximino Greenland, MD.  This visit occurred during the SARS-CoV-2 public health emergency.  Safety protocols were in place, including screening questions prior to the visit, additional usage of staff PPE, and extensive cleaning of exam room while observing appropriate contact time as indicated for disinfecting solutions.  Subjective:     Patient ID: Lawrence Harrell , male    DOB: 1945/03/31 , 75 y.o.   MRN: [id number]   Chief Complaint  Patient presents with  . Hypertension    HPI  The patient is here today for a follow-up on HTN and hyperlipidemia. The patient reports compliance with his medications. He has had no issues with the medication. He denies headaches, chest pain and shortness of breath.   Hypertension This is a chronic problem. The current episode started more than 1 year ago. The problem has been gradually improving since onset. The problem is controlled. Pertinent negatives include no blurred vision, chest pain, palpitations or shortness of breath. Risk factors for coronary artery disease include dyslipidemia and male gender. The current treatment provides moderate improvement. Hypertensive end-organ damage includes kidney disease.    Past Medical History:  Diagnosis Date  . BPH with obstruction/lower urinary tract symptoms   . High cholesterol   . HTN (hypertension)   . Prediabetes   . Renal cell cancer, right (HCC)      Family History  Problem Relation Age of Onset  . Hypertension Mother   . Hypertension Father      Current Outpatient Medications:  .  Cholecalciferol (VITAMIN D3) 2000 units TABS, Take by mouth., Disp: , Rfl:  .  pravastatin (PRAVACHOL) 40 MG tablet, TAKE 1 TABLET BY MOUTH  DAILY, Disp: 90 tablet, Rfl: 3 .  tamsulosin (FLOMAX) 0.4 MG CAPS capsule,  TAKE 1 CAPSULE BY MOUTH  DAILY 1/2 HOUR FOLLOWING  THE SAME MEAL, Disp: 90 capsule, Rfl: 3 .  amLODipine (NORVASC) 10 MG tablet, TAKE 1 TABLET BY MOUTH  DAILY, Disp: 90 tablet, Rfl: 3 .  promethazine-dextromethorphan (PROMETHAZINE-DM) 6.25-15 MG/5ML syrup, Take 5 mLs by mouth 4 (four) times daily as needed for cough. (Patient not taking: Reported on 12/19/2020), Disp: 118 mL, Rfl: 0 .  valsartan-hydrochlorothiazide (DIOVAN-HCT) 160-25 MG tablet, TAKE 1 TABLET BY MOUTH  DAILY, Disp: 90 tablet, Rfl: 3  Current Facility-Administered Medications:  .  0.9 %  sodium chloride infusion, , Intravenous, PRN, [name], FNP   No Known Allergies   Review of Systems  Constitutional: Negative.   Eyes:  Negative for blurred vision.  Respiratory: Negative.  Negative for shortness of breath.   Cardiovascular: Negative.  Negative for chest pain and palpitations.  Gastrointestinal: Negative.   Neurological: Negative.     Today's Vitals   12/19/20 0856  BP: 138/72  Pulse: 98  Temp: 98.1 F (36.7 C)  Weight: 149 lb 9.6 oz (67.9 kg)  Height: 6' (1.829 m)  PainSc: 0-No pain   Body mass index is 20.29 kg/m.  Wt Readings from Last 3 Encounters:  12/19/20 149 lb 9.6 oz (67.9 kg)  05/22/20 169 lb (76.7 kg)  04/27/20 175 lb (79.4 kg)    Objective:  Physical Exam Vitals and nursing note reviewed.  Constitutional:      Appearance: Normal appearance.  HENT:     Head: Normocephalic and atraumatic.  Nose:     Comments: Masked     Mouth/Throat:     Comments: Masked  Eyes:     Extraocular Movements: Extraocular movements intact.  Cardiovascular:     Rate and Rhythm: Regular rhythm. Tachycardia present.     Heart sounds: Normal heart sounds.  Pulmonary:     Effort: Pulmonary effort is normal.     Breath sounds: Normal breath sounds.  Skin:    General: Skin is warm.  Neurological:     General: No focal deficit present.     Mental Status: He is alert.  Psychiatric:        Mood and  Affect: Mood normal.        Assessment And Plan:     1. Hypertensive nephropathy Comments: Chronic, fair control. Goal BP is less than 130/80. Encouraged to follow low sodium diet.  No med changes today.  He will f/u in 6 months.  - CMP14+EGFR - Hemoglobin A1c  2. Chronic renal disease, stage II Comments: Chronic, I will check renal function today. Encouraged to stay well hydrated and keep Bp less than 130/80 to decrease progression of CKD.  - Microalbumin / Creatinine Urine Ratio  3. Weight loss, unintentional Comments: He has lost 20 pounds since March 2022.He was given stool cards to complete. I will check u/a to look for glucosoria and check thyroid function.  - TSH - T4, free - T3, free - CBC with Diff - POCT Urinalysis Dipstick (81002)  4. Other abnormal glucose Comments: His a1c has been elevated in the past. I will recheck this today. He is encouraged to limit intake of sweetened beverages.   5. Personal history of COVID-19 Comments: He is fully vaccinated and boosted x 1.    Patient was given opportunity to ask questions. Patient verbalized understanding of the plan and was able to repeat key elements of the plan. All questions were answered to their satisfaction.   I, Maximino Greenland, MD, have reviewed all documentation for this visit. The documentation on 12/19/20 for the exam, diagnosis, procedures, and orders are all accurate and complete.   IF YOU HAVE BEEN REFERRED TO A SPECIALIST, IT MAY TAKE 1-2 WEEKS TO SCHEDULE/PROCESS THE REFERRAL. IF YOU HAVE NOT HEARD FROM US/SPECIALIST IN TWO WEEKS, PLEASE GIVE [place] A CALL AT [phone].   THE PATIENT IS ENCOURAGED TO PRACTICE SOCIAL DISTANCING DUE TO THE COVID-19 PANDEMIC.

## 2020-12-19 NOTE — Patient Instructions (Signed)

## 2020-12-20 ENCOUNTER — Encounter: Payer: Medicare Other | Admitting: Nurse Practitioner

## 2020-12-20 LAB — CBC WITH DIFFERENTIAL/PLATELET
Basophils Absolute: 0 10*3/uL (ref 0.0–0.2)
Basos: 0 %
EOS (ABSOLUTE): 0.1 10*3/uL (ref 0.0–0.4)
Eos: 2 %
Hematocrit: 36.7 % — ABNORMAL LOW (ref 37.5–51.0)
Hemoglobin: 11.3 g/dL — ABNORMAL LOW (ref 13.0–17.7)
Immature Grans (Abs): 0 10*3/uL (ref 0.0–0.1)
Immature Granulocytes: 0 %
Lymphocytes Absolute: 1.7 10*3/uL (ref 0.7–3.1)
Lymphs: 38 %
MCH: 24.4 pg — ABNORMAL LOW (ref 26.6–33.0)
MCHC: 30.8 g/dL — ABNORMAL LOW (ref 31.5–35.7)
MCV: 79 fL (ref 79–97)
Monocytes Absolute: 0.6 10*3/uL (ref 0.1–0.9)
Monocytes: 14 %
Neutrophils Absolute: 2.1 10*3/uL (ref 1.4–7.0)
Neutrophils: 46 %
Platelets: 176 10*3/uL (ref 150–450)
RBC: 4.63 x10E6/uL (ref 4.14–5.80)
RDW: 14.5 % (ref 11.6–15.4)
WBC: 4.5 10*3/uL (ref 3.4–10.8)

## 2020-12-20 LAB — CMP14+EGFR
ALT: 25 IU/L (ref 0–44)
AST: 21 IU/L (ref 0–40)
Albumin/Globulin Ratio: 1.4 (ref 1.2–2.2)
Albumin: 4 g/dL (ref 3.7–4.7)
Alkaline Phosphatase: 157 IU/L — ABNORMAL HIGH (ref 44–121)
BUN/Creatinine Ratio: 19 (ref 10–24)
BUN: 17 mg/dL (ref 8–27)
Bilirubin Total: 0.4 mg/dL (ref 0.0–1.2)
CO2: 30 mmol/L — ABNORMAL HIGH (ref 20–29)
Calcium: 10.1 mg/dL (ref 8.6–10.2)
Chloride: 101 mmol/L (ref 96–106)
Creatinine, Ser: 0.89 mg/dL (ref 0.76–1.27)
Globulin, Total: 2.9 g/dL (ref 1.5–4.5)
Glucose: 101 mg/dL — ABNORMAL HIGH (ref 70–99)
Potassium: 3.7 mmol/L (ref 3.5–5.2)
Sodium: 142 mmol/L (ref 134–144)
Total Protein: 6.9 g/dL (ref 6.0–8.5)
eGFR: 89 mL/min/{1.73_m2} (ref >59)

## 2020-12-20 LAB — MICROALBUMIN / CREATININE URINE RATIO
Creatinine, Urine: 89.2 mg/dL
Microalb/Creat Ratio: 13 mg/g creat (ref 0–29)
Microalbumin, Urine: 11.3 ug/mL

## 2020-12-20 LAB — HEMOGLOBIN A1C
Est. average glucose Bld gHb Est-mCnc: 131 mg/dL
Hgb A1c MFr Bld: 6.2 % — ABNORMAL HIGH (ref 4.8–5.6)

## 2020-12-20 LAB — T3, FREE: T3, Free: 10.2 pg/mL — ABNORMAL HIGH (ref 2.0–4.4)

## 2020-12-20 LAB — TSH: TSH: <0.005 u[IU]/mL — ABNORMAL LOW (ref 0.450–4.500)

## 2020-12-20 LAB — T4, FREE: Free T4: 3.28 ng/dL — ABNORMAL HIGH (ref 0.82–1.77)

## 2020-12-21 ENCOUNTER — Other Ambulatory Visit: Payer: Self-pay

## 2020-12-21 ENCOUNTER — Other Ambulatory Visit: Payer: Self-pay | Admitting: Internal Medicine

## 2020-12-21 DIAGNOSIS — R946 Abnormal results of thyroid function studies: Secondary | ICD-10-CM

## 2020-12-29 ENCOUNTER — Encounter: Payer: Self-pay | Admitting: Internal Medicine

## 2021-01-16 ENCOUNTER — Other Ambulatory Visit: Payer: Self-pay

## 2021-01-16 ENCOUNTER — Telehealth: Payer: Self-pay | Admitting: Endocrinology

## 2021-01-16 ENCOUNTER — Ambulatory Visit (INDEPENDENT_AMBULATORY_CARE_PROVIDER_SITE_OTHER): Payer: Medicare Other | Admitting: Endocrinology

## 2021-01-16 ENCOUNTER — Encounter: Payer: Self-pay | Admitting: Endocrinology

## 2021-01-16 DIAGNOSIS — E0590 Thyrotoxicosis, unspecified without thyrotoxic crisis or storm: Secondary | ICD-10-CM

## 2021-01-16 DIAGNOSIS — R946 Abnormal results of thyroid function studies: Secondary | ICD-10-CM

## 2021-01-16 MED ORDER — METOPROLOL SUCCINATE ER 25 MG PO TB24: 25.0000 mg | ORAL_TABLET | Freq: Every day | ORAL | 3 refills | Status: DC

## 2021-01-16 MED ORDER — METHIMAZOLE 10 MG PO TABS: 20.0000 mg | ORAL_TABLET | Freq: Two times a day (BID) | ORAL | 1 refills | Status: DC

## 2021-01-16 NOTE — Progress Notes (Signed)
Subjective:    Patient ID: Lawrence Harrell, male    DOB: 1945-07-22, 75 y.o.   MRN: [id number]  HPI Pt is referred by Dr Baird Cancer, for hyperthyroidism.  Pt reports he was dx'ed with hyperthyroidism 1 month ago.  He has never been on therapy for this.  He has never had XRT to the anterior neck, or thyroid surgery.  He has never had thyroid imaging.  He does not consume kelp or any other non-prescribed thyroid medication.  He has never been on amiodarone.  He has lost 30 lbs x 6 mos, despite good appetite.  Pt says he takes Bp meds as rx'ed.   Past Medical History:  Diagnosis Date   BPH with obstruction/lower urinary tract symptoms    High cholesterol    HTN (hypertension)    Prediabetes    Renal cell cancer, right [facility])     Past Surgical History:  Procedure Laterality Date   gland extraction  09/2005   Salivary    nephrectomy  10/06/2013   partial     Social History   Socioeconomic History   Marital status: Married    Spouse name: Not on file   Number of children: Not on file   Years of education: Not on file   Highest education level: Not on file  Occupational History   Not on file  Tobacco Use   Smoking status: Never   Smokeless tobacco: Never  Vaping Use   Vaping Use: Never used  Substance and Sexual Activity   Alcohol use: Never   Drug use: Never   Sexual activity: Yes  Other Topics Concern   Not on file  Social History Narrative   Not on file   Social Determinants of Health   Financial Resource Strain: Low Risk    Difficulty of Paying Living Expenses: Not hard at all  Food Insecurity: No Food Insecurity   Worried About Charity fundraiser in the Last Year: Never true   [place] in the Last Year: Never true  Transportation Needs: No Transportation Needs   Lack of Transportation (Medical): No   Lack of Transportation (Non-Medical): No  Physical Activity: Insufficiently Active   Days of Exercise per Week: 7 days   Minutes of Exercise per Session: 10  min  Stress: No Stress Concern Present   Feeling of Stress : Not at all  Social Connections: Not on file  Intimate Partner Violence: Not on file    Current Outpatient Medications on File Prior to Visit  Medication Sig Dispense Refill   amLODipine (NORVASC) 10 MG tablet TAKE 1 TABLET BY MOUTH  DAILY 90 tablet 3   Cholecalciferol (VITAMIN D3) 2000 units TABS Take by mouth.     pravastatin (PRAVACHOL) 40 MG tablet TAKE 1 TABLET BY MOUTH  DAILY 90 tablet 3   valsartan-hydrochlorothiazide (DIOVAN-HCT) 160-25 MG tablet TAKE 1 TABLET BY MOUTH  DAILY 90 tablet 3   promethazine-dextromethorphan (PROMETHAZINE-DM) 6.25-15 MG/5ML syrup Take 5 mLs by mouth 4 (four) times daily as needed for cough. (Patient not taking: No sig reported) 118 mL 0   tamsulosin (FLOMAX) 0.4 MG CAPS capsule TAKE 1 CAPSULE BY MOUTH  DAILY 1/2 HOUR FOLLOWING  THE SAME MEAL (Patient not taking: Reported on 01/16/2021) 90 capsule 3   Current Facility-Administered Medications on File Prior to Visit  Medication Dose Route Frequency Provider Last Rate Last Admin   0.9 %  sodium chloride infusion   Intravenous PRN [name], FNP  No Known Allergies  Family History  Problem Relation Age of Onset   Hypertension Mother    Hypertension Father    Thyroid disease Neg Hx     BP (!) 180/60 (BP Location: Right Arm, Patient Position: Sitting, Cuff Size: Normal)   Pulse (!) 103   Ht 6' (1.829 m)   Wt 153 lb 6.4 oz (69.6 kg)   SpO2 98%   BMI 20.80 kg/m    Review of Systems denies palpitations, sob, excessive diaphoresis, tremor, anxiety, and heat intolerance.      Objective:   Physical Exam VS: see vs page GEN: no distress HEAD: head: no deformity eyes: no periorbital swelling, no proptosis external nose and ears are normal NECK: supple, thyroid is not enlarged CHEST WALL: no deformity LUNGS: clear to auscultation CV: reg rate and rhythm, no murmur.  MUSCULOSKELETAL: gait is normal and steady EXTEMITIES: no  deformity.  no leg edema NEURO:  readily moves all 4's.  sensation is intact to touch on all 4's.  Slight tremor of the hands.   SKIN:  Normal texture and temperature.  No rash or suspicious lesion is visible.  Not diaphoretic.   NODES:  None palpable at the neck PSYCH: alert, well-oriented.  Does not appear anxious nor depressed.      Lab Results  Component Value Date   TSH <0.005 (L) 12/19/2020   I have reviewed outside records, and summarized: Pt was noted to have abnormal TFT, and referred here.  HTN and dyslipidemia were also addressed     Assessment & Plan:  Hyperthyroidism, uncontrolled.  We discussed rx options.  I advised tapazole, at least for now, due to need for prompt improvement.    Patient Instructions  I have sent 2 prescriptions to your pharmacy: to slow the thyroid, and for the heart racing.   If ever you have fever while taking methimazole, stop it and call us, even if the reason is obvious, because of the risk of a rare side-effect.   It is best to never miss the medication.  However, if you do miss it, next best is to double up the next time.   Please come back for a follow-up appointment in 4 weeks.

## 2021-01-16 NOTE — Telephone Encounter (Signed)
Patient's wife Marlynn Perking called re: RX for methimazole (TAPAZOLE) 10 MG tablet [facility] to be [facility] to [facility] on [address] in [place] ([facility]  Marlynn Perking requests to be called at ph# [phone] once the RX has been sent to [facility].

## 2021-01-16 NOTE — Patient Instructions (Addendum)
I have sent 2 prescriptions to your pharmacy: to slow the thyroid, and for the heart racing.   If ever you have fever while taking methimazole, stop it and call us, even if the reason is obvious, because of the risk of a rare side-effect.   It is best to never miss the medication.  However, if you do miss it, next best is to double up the next time.   Please come back for a follow-up appointment in 4 weeks.

## 2021-01-16 NOTE — Telephone Encounter (Signed)
Rx sent 

## 2021-01-20 DIAGNOSIS — E0590 Thyrotoxicosis, unspecified without thyrotoxic crisis or storm: Secondary | ICD-10-CM | POA: Insufficient documentation

## 2021-01-22 ENCOUNTER — Encounter: Payer: Self-pay | Admitting: Internal Medicine

## 2021-01-22 ENCOUNTER — Telehealth: Payer: Self-pay

## 2021-01-22 NOTE — Telephone Encounter (Signed)
Mrs. Lacek was notified that the pt's [employer] forms has been completed and faxed, original placed up front for pickup.

## 2021-02-13 ENCOUNTER — Ambulatory Visit (INDEPENDENT_AMBULATORY_CARE_PROVIDER_SITE_OTHER): Payer: Medicare Other | Admitting: Endocrinology

## 2021-02-13 ENCOUNTER — Encounter: Payer: Self-pay | Admitting: Endocrinology

## 2021-02-13 ENCOUNTER — Other Ambulatory Visit: Payer: Self-pay

## 2021-02-13 VITALS — BP 160/60 | HR 98 | Ht 72.0 in | Wt 155.6 lb

## 2021-02-13 DIAGNOSIS — E0590 Thyrotoxicosis, unspecified without thyrotoxic crisis or storm: Secondary | ICD-10-CM | POA: Diagnosis not present

## 2021-02-13 LAB — T4, FREE: Free T4: 1.94 ng/dL — ABNORMAL HIGH (ref 0.60–1.60)

## 2021-02-13 LAB — TSH: TSH: <0.01 u[IU]/mL (ref 0.35–5.50)

## 2021-02-13 NOTE — Patient Instructions (Addendum)
Blood tests are requested for you today.  We'll let you know about the results.   Based on the results, we may be able to reduce the methimazole.  Please continue the same metoprolol.   If ever you have fever while taking methimazole, stop it and call us, even if the reason is obvious, because of the risk of a rare side-effect.   It is best to never miss the medication.  However, if you do miss it, next best is to double up the next time.    Please come back for a follow-up appointment in 6 weeks.

## 2021-02-13 NOTE — Progress Notes (Signed)
Subjective:    Patient ID: Lawrence Harrell, male    DOB: [birth year], 75 y.o.   MRN: [id number]  HPI Pt returns for f/u of hyperthyroidism (dx'ed 2022; tapazole was chosen as rx, due to severity; he has never had thyroid imaging).  Since on these 2 meds, pt states he feels better in general.   Past Medical History:  Diagnosis Date   BPH with obstruction/lower urinary tract symptoms    High cholesterol    HTN (hypertension)    Prediabetes    Renal cell cancer, right [facility])     Past Surgical History:  Procedure Laterality Date   gland extraction  09/2005   Salivary    nephrectomy  10/06/2013   partial     Social History   Socioeconomic History   Marital status: Married    Spouse name: Not on file   Number of children: Not on file   Years of education: Not on file   Highest education level: Not on file  Occupational History   Not on file  Tobacco Use   Smoking status: Never   Smokeless tobacco: Never  Vaping Use   Vaping Use: Never used  Substance and Sexual Activity   Alcohol use: Never   Drug use: Never   Sexual activity: Yes  Other Topics Concern   Not on file  Social History Narrative   Not on file   Social Determinants of Health   Financial Resource Strain: Low Risk    Difficulty of Paying Living Expenses: Not hard at all  Food Insecurity: No Food Insecurity   Worried About Charity fundraiser in the Last Year: Never true   Ran Out of Food in the Last Year: Never true  Transportation Needs: No Transportation Needs   Lack of Transportation (Medical): No   Lack of Transportation (Non-Medical): No  Physical Activity: Insufficiently Active   Days of Exercise per Week: 7 days   Minutes of Exercise per Session: 10 min  Stress: No Stress Concern Present   Feeling of Stress : Not at all  Social Connections: Not on file  Intimate Partner Violence: Not on file    Current Outpatient Medications on File Prior to Visit  Medication Sig Dispense Refill    amLODipine (NORVASC) 10 MG tablet TAKE 1 TABLET BY MOUTH  DAILY 90 tablet 3   Cholecalciferol (VITAMIN D3) 2000 units TABS Take by mouth.     methimazole (TAPAZOLE) 10 MG tablet Take 2 tablets (20 mg total) by mouth 2 (two) times daily. 360 tablet 1   metoprolol succinate (TOPROL-XL) 25 MG 24 hr tablet Take 1 tablet (25 mg total) by mouth daily. 90 tablet 3   pravastatin (PRAVACHOL) 40 MG tablet TAKE 1 TABLET BY MOUTH  DAILY 90 tablet 3   promethazine-dextromethorphan (PROMETHAZINE-DM) 6.25-15 MG/5ML syrup Take 5 mLs by mouth 4 (four) times daily as needed for cough. 118 mL 0   tamsulosin (FLOMAX) 0.4 MG CAPS capsule TAKE 1 CAPSULE BY MOUTH  DAILY 1/2 HOUR FOLLOWING  THE SAME MEAL 90 capsule 3   valsartan-hydrochlorothiazide (DIOVAN-HCT) 160-25 MG tablet TAKE 1 TABLET BY MOUTH  DAILY 90 tablet 3   Current Facility-Administered Medications on File Prior to Visit  Medication Dose Route Frequency Provider Last Rate Last Admin   0.9 %  sodium chloride infusion   Intravenous PRN [name], FNP        No Known Allergies  Family History  Problem Relation Age of Onset   Hypertension Mother  Hypertension Father    Thyroid disease Sister     BP (!) 160/60   Pulse 98   Ht 6' (1.829 m)   Wt 155 lb 9.6 oz (70.6 kg)   SpO2 97%   BMI 21.10 kg/m    Review of Systems Denies fever.     Objective:   Physical Exam NECK: There is no palpable thyroid enlargement.  No thyroid nodule is palpable.  No palpable lymphadenopathy at the anterior neck.    Lab Results  Component Value Date   TSH <0.01 02/13/2021       Assessment & Plan:  Hyperthyroidism, uncontrolled.  Please continue the same methimazole, as it will prob work more with time.

## 2021-04-02 ENCOUNTER — Other Ambulatory Visit: Payer: Self-pay

## 2021-04-02 ENCOUNTER — Ambulatory Visit (INDEPENDENT_AMBULATORY_CARE_PROVIDER_SITE_OTHER): Payer: Medicare Other | Admitting: Endocrinology

## 2021-04-02 VITALS — BP 152/80 | HR 88 | Ht 72.0 in | Wt 164.8 lb

## 2021-04-02 DIAGNOSIS — R946 Abnormal results of thyroid function studies: Secondary | ICD-10-CM | POA: Diagnosis not present

## 2021-04-02 DIAGNOSIS — E0590 Thyrotoxicosis, unspecified without thyrotoxic crisis or storm: Secondary | ICD-10-CM | POA: Diagnosis not present

## 2021-04-02 LAB — T4, FREE: Free T4: 0.64 ng/dL (ref 0.60–1.60)

## 2021-04-02 LAB — TSH: TSH: 0.01 u[IU]/mL — ABNORMAL LOW (ref 0.35–5.50)

## 2021-04-02 MED ORDER — METHIMAZOLE 10 MG PO TABS: 10.0000 mg | ORAL_TABLET | Freq: Two times a day (BID) | ORAL | 1 refills | Status: DC

## 2021-04-02 NOTE — Patient Instructions (Addendum)
Blood tests are requested for you today.  We'll let you know about the results.   Please continue the same metoprolol.   If ever you have fever while taking methimazole, stop it and call us, even if the reason is obvious, because of the risk of a rare side-effect.   It is best to never miss the medication.  However, if you do miss it, next best is to double up the next time.    Please come back for a follow-up appointment in 6 weeks.

## 2021-04-02 NOTE — Progress Notes (Signed)
Subjective:    Patient ID: Lawrence Harrell, male    DOB: 07/26/1945, 76 y.o.   MRN: [id number]  HPI Pt returns for f/u of hyperthyroidism (dx'ed 2022; tapazole was chosen as rx, due to severity; he has never had thyroid imaging).  pt states he continues to feel better in general.  He takes meds as rx'ed.   Past Medical History:  Diagnosis Date   BPH with obstruction/lower urinary tract symptoms    High cholesterol    HTN (hypertension)    Prediabetes    Renal cell cancer, right [facility])     Past Surgical History:  Procedure Laterality Date   gland extraction  09/2005   Salivary    nephrectomy  10/06/2013   partial     Social History   Socioeconomic History   Marital status: Married    Spouse name: Not on file   Number of children: Not on file   Years of education: Not on file   Highest education level: Not on file  Occupational History   Not on file  Tobacco Use   Smoking status: Never   Smokeless tobacco: Never  Vaping Use   Vaping Use: Never used  Substance and Sexual Activity   Alcohol use: Never   Drug use: Never   Sexual activity: Yes  Other Topics Concern   Not on file  Social History Narrative   Not on file   Social Determinants of Health   Financial Resource Strain: Low Risk    Difficulty of Paying Living Expenses: Not hard at all  Food Insecurity: No Food Insecurity   Worried About Charity fundraiser in the Last Year: Never true   [place] in the Last Year: Never true  Transportation Needs: No Transportation Needs   Lack of Transportation (Medical): No   Lack of Transportation (Non-Medical): No  Physical Activity: Insufficiently Active   Days of Exercise per Week: 7 days   Minutes of Exercise per Session: 10 min  Stress: No Stress Concern Present   Feeling of Stress : Not at all  Social Connections: Not on file  Intimate Partner Violence: Not on file    Current Outpatient Medications on File Prior to Visit  Medication Sig Dispense  Refill   amLODipine (NORVASC) 10 MG tablet TAKE 1 TABLET BY MOUTH  DAILY 90 tablet 3   Cholecalciferol (VITAMIN D3) 2000 units TABS Take by mouth.     metoprolol succinate (TOPROL-XL) 25 MG 24 hr tablet Take 1 tablet (25 mg total) by mouth daily. 90 tablet 3   pravastatin (PRAVACHOL) 40 MG tablet TAKE 1 TABLET BY MOUTH  DAILY 90 tablet 3   promethazine-dextromethorphan (PROMETHAZINE-DM) 6.25-15 MG/5ML syrup Take 5 mLs by mouth 4 (four) times daily as needed for cough. 118 mL 0   tamsulosin (FLOMAX) 0.4 MG CAPS capsule TAKE 1 CAPSULE BY MOUTH  DAILY 1/2 HOUR FOLLOWING  THE SAME MEAL 90 capsule 3   valsartan-hydrochlorothiazide (DIOVAN-HCT) 160-25 MG tablet TAKE 1 TABLET BY MOUTH  DAILY 90 tablet 3   Current Facility-Administered Medications on File Prior to Visit  Medication Dose Route Frequency Provider Last Rate Last Admin   0.9 %  sodium chloride infusion   Intravenous PRN [name], FNP        No Known Allergies  Family History  Problem Relation Age of Onset   Hypertension Mother    Hypertension Father    Thyroid disease Sister     BP (!) 152/80  Pulse 88    Ht 6' (1.829 m)    Wt 164 lb 12.8 oz (74.8 kg)    SpO2 97%    BMI 22.35 kg/m    Review of Systems Denies fever.      Objective:   Physical Exam NECK: There is no palpable thyroid enlargement.  No thyroid nodule is palpable.  No palpable lymphadenopathy at the anterior neck.     Lab Results  Component Value Date   TSH 0.01 (L) 04/02/2021      Assessment & Plan:  Hyperthyroidism: improved, but still uncontrolled.  Reduce tapazole to 10-BID

## 2021-04-06 ENCOUNTER — Other Ambulatory Visit: Payer: Self-pay | Admitting: Internal Medicine

## 2021-04-17 ENCOUNTER — Other Ambulatory Visit: Payer: Self-pay | Admitting: Endocrinology

## 2021-04-17 DIAGNOSIS — R946 Abnormal results of thyroid function studies: Secondary | ICD-10-CM

## 2021-04-24 ENCOUNTER — Other Ambulatory Visit: Payer: Self-pay

## 2021-04-24 ENCOUNTER — Ambulatory Visit (INDEPENDENT_AMBULATORY_CARE_PROVIDER_SITE_OTHER): Payer: Medicare Other | Admitting: Internal Medicine

## 2021-04-24 ENCOUNTER — Encounter: Payer: Self-pay | Admitting: Internal Medicine

## 2021-04-24 VITALS — BP 146/82 | HR 84 | Temp 97.9°F | Ht 69.8 in | Wt 169.2 lb

## 2021-04-24 DIAGNOSIS — E0590 Thyrotoxicosis, unspecified without thyrotoxic crisis or storm: Secondary | ICD-10-CM | POA: Diagnosis not present

## 2021-04-24 DIAGNOSIS — R748 Abnormal levels of other serum enzymes: Secondary | ICD-10-CM | POA: Diagnosis not present

## 2021-04-24 DIAGNOSIS — Z23 Encounter for immunization: Secondary | ICD-10-CM

## 2021-04-24 DIAGNOSIS — R7309 Other abnormal glucose: Secondary | ICD-10-CM

## 2021-04-24 DIAGNOSIS — I129 Hypertensive chronic kidney disease with stage 1 through stage 4 chronic kidney disease, or unspecified chronic kidney disease: Secondary | ICD-10-CM | POA: Diagnosis not present

## 2021-04-24 DIAGNOSIS — Z0000 Encounter for general adult medical examination without abnormal findings: Secondary | ICD-10-CM

## 2021-04-24 DIAGNOSIS — N182 Chronic kidney disease, stage 2 (mild): Secondary | ICD-10-CM | POA: Diagnosis not present

## 2021-04-24 DIAGNOSIS — M7989 Other specified soft tissue disorders: Secondary | ICD-10-CM

## 2021-04-24 LAB — CMP14+EGFR
ALT: 14 IU/L (ref 0–44)
AST: 19 IU/L (ref 0–40)
Albumin/Globulin Ratio: 1.5 (ref 1.2–2.2)
Albumin: 4.6 g/dL (ref 3.7–4.7)
Alkaline Phosphatase: 209 IU/L — ABNORMAL HIGH (ref 44–121)
BUN/Creatinine Ratio: 11 (ref 10–24)
BUN: 14 mg/dL (ref 8–27)
Bilirubin Total: 0.5 mg/dL (ref 0.0–1.2)
CO2: 29 mmol/L (ref 20–29)
Calcium: 9.7 mg/dL (ref 8.6–10.2)
Chloride: 99 mmol/L (ref 96–106)
Creatinine, Ser: 1.23 mg/dL (ref 0.76–1.27)
Globulin, Total: 3 g/dL (ref 1.5–4.5)
Glucose: 92 mg/dL (ref 70–99)
Potassium: 3.6 mmol/L (ref 3.5–5.2)
Sodium: 144 mmol/L (ref 134–144)
Total Protein: 7.6 g/dL (ref 6.0–8.5)
eGFR: 61 mL/min/{1.73_m2} (ref >59)

## 2021-04-24 LAB — POCT URINALYSIS DIPSTICK
Bilirubin, UA: NEGATIVE
Blood, UA: NEGATIVE
Glucose, UA: NEGATIVE
Ketones, UA: NEGATIVE
Leukocytes, UA: NEGATIVE
Nitrite, UA: NEGATIVE
Protein, UA: NEGATIVE
Spec Grav, UA: 1.02 (ref 1.010–1.025)
Urobilinogen, UA: 0.2 E.U./dL
pH, UA: 6 (ref 5.0–8.0)

## 2021-04-24 LAB — HEMOGLOBIN A1C
Est. average glucose Bld gHb Est-mCnc: 120 mg/dL
Hgb A1c MFr Bld: 5.8 % — ABNORMAL HIGH (ref 4.8–5.6)

## 2021-04-24 LAB — LIPID PANEL
Chol/HDL Ratio: 2.6 ratio (ref 0.0–5.0)
Cholesterol, Total: 188 mg/dL (ref 100–199)
HDL: 72 mg/dL (ref >39)
LDL Chol Calc (NIH): 106 mg/dL — ABNORMAL HIGH (ref 0–99)
Triglycerides: 54 mg/dL (ref 0–149)
VLDL Cholesterol Cal: 10 mg/dL (ref 5–40)

## 2021-04-24 LAB — CBC
Hematocrit: 44.2 % (ref 37.5–51.0)
Hemoglobin: 13.9 g/dL (ref 13.0–17.7)
MCH: 25.8 pg — ABNORMAL LOW (ref 26.6–33.0)
MCHC: 31.4 g/dL — ABNORMAL LOW (ref 31.5–35.7)
MCV: 82 fL (ref 79–97)
Platelets: 170 10*3/uL (ref 150–450)
RBC: 5.38 x10E6/uL (ref 4.14–5.80)
RDW: 15.5 % — ABNORMAL HIGH (ref 11.6–15.4)
WBC: 5.3 10*3/uL (ref 3.4–10.8)

## 2021-04-24 NOTE — Progress Notes (Signed)
I,Katawbba Wiggins,acting as a Education administrator for Maximino Greenland, MD.,have documented all relevant documentation on the behalf of Maximino Greenland, MD,as directed by  Maximino Greenland, MD while in the presence of Maximino Greenland, MD.  This visit occurred during the SARS-CoV-2 public health emergency.  Safety protocols were in place, including screening questions prior to the visit, additional usage of staff PPE, and extensive cleaning of exam room while observing appropriate contact time as indicated for disinfecting solutions.  Subjective:     Patient ID: Lawrence Harrell , male    DOB: [birth year] , 76 y.o.   MRN: [id number]   Chief Complaint  Patient presents with   Annual Exam   Hypertension    HPI  He is here today for a full physical examination. He has no specific concerns at this time. He reports compliance with meds. He denies headaches, chest pain and shortness of breath.   At his last visit, he was diagnosed with hyperthyroidism and referred to Endocrinology. He is now on methimazole and feeling much better. He is pleased with his weight gain and enjoyed his visits with Endo.   Hypertension This is a chronic problem. The current episode started more than 1 year ago. The problem has been gradually improving since onset. The problem is controlled. Pertinent negatives include no blurred vision, chest pain, palpitations or shortness of breath. Risk factors for coronary artery disease include dyslipidemia. Past treatments include angiotensin blockers. The current treatment provides moderate improvement. Hypertensive end-organ damage includes kidney disease.  Hyperlipidemia Pertinent negatives include no chest pain or shortness of breath.    Past Medical History:  Diagnosis Date   BPH with obstruction/lower urinary tract symptoms    High cholesterol    HTN (hypertension)    Prediabetes    Renal cell cancer, right ([place])      Family History  Problem Relation Age of Onset   Hypertension  Mother    Hypertension Father    Thyroid disease Sister      Current Outpatient Medications:    amLODipine (NORVASC) 10 MG tablet, TAKE 1 TABLET BY MOUTH  DAILY, Disp: 90 tablet, Rfl: 3   Cholecalciferol (VITAMIN D3) 2000 units TABS, Take by mouth., Disp: , Rfl:    methimazole (TAPAZOLE) 10 MG tablet, Take 1 tablet (10 mg total) by mouth 2 (two) times daily., Disp: 180 tablet, Rfl: 1   metoprolol succinate (TOPROL-XL) 25 MG 24 hr tablet, Take 1 tablet (25 mg total) by mouth daily., Disp: 90 tablet, Rfl: 3   pravastatin (PRAVACHOL) 40 MG tablet, TAKE 1 TABLET BY MOUTH  DAILY, Disp: 90 tablet, Rfl: 3   valsartan-hydrochlorothiazide (DIOVAN-HCT) 160-25 MG tablet, TAKE 1 TABLET BY MOUTH  DAILY, Disp: 90 tablet, Rfl: 3   promethazine-dextromethorphan (PROMETHAZINE-DM) 6.25-15 MG/5ML syrup, Take 5 mLs by mouth 4 (four) times daily as needed for cough. (Patient not taking: Reported on 04/24/2021), Disp: 118 mL, Rfl: 0   tamsulosin (FLOMAX) 0.4 MG CAPS capsule, TAKE 1 CAPSULE BY MOUTH  DAILY 1/2 HOUR FOLLOWING  THE SAME MEAL (Patient not taking: Reported on 04/24/2021), Disp: 90 capsule, Rfl: 3   No Known Allergies    Men's preventive visit. Patient Health Questionnaire (PHQ-2) is  [place] [facility] Visit from 04/24/2021 in [facility]  PHQ-2 Total Score 0     . Patient is on a low sodium diet. Marital status: Married. Relevant history for alcohol use is:  Social History   Substance and Sexual Activity  Alcohol Use  Never  . Relevant history for tobacco use is:  Social History   Tobacco Use  Smoking Status Never  Smokeless Tobacco Never  .  Marland Kitchen The patient's tobacco use is:  Social History   Tobacco Use  Smoking Status Never  Smokeless Tobacco Never  . She has been exposed to passive smoke. The patient's alcohol use is:  Social History   Substance and Sexual Activity  Alcohol Use Never   Review of Systems  Constitutional: Negative.   HENT: Negative.     Eyes: Negative.  Negative for blurred vision.  Respiratory: Negative.  Negative for shortness of breath.   Cardiovascular: Negative.  Negative for chest pain and palpitations.  Gastrointestinal: Negative.   Endocrine: Negative.   Genitourinary: Negative.   Musculoskeletal: Negative.   Skin: Negative.   Allergic/Immunologic: Negative.   Neurological: Negative.   Hematological: Negative.   Psychiatric/Behavioral: Negative.      Today's Vitals   04/24/21 0912  BP: (!) 146/82  Pulse: 84  Temp: 97.9 F (36.6 C)  Weight: 169 lb 3.2 oz (76.7 kg)  Height: 5' 9.8" (1.773 m)   Body mass index is 24.42 kg/m.   Wt Readings from Last 3 Encounters:  04/24/21 169 lb 3.2 oz (76.7 kg)  04/02/21 164 lb 12.8 oz (74.8 kg)  02/13/21 155 lb 9.6 oz (70.6 kg)    BP Readings from Last 3 Encounters:  04/24/21 (!) 146/82  04/02/21 (!) 152/80  02/13/21 (!) 160/60     Objective:  Physical Exam Vitals and nursing note reviewed.  Constitutional:      Appearance: Normal appearance.  HENT:     Head: Normocephalic and atraumatic.     Right Ear: Tympanic membrane, ear canal and external ear normal.     Left Ear: Tympanic membrane, ear canal and external ear normal.     Nose:     Comments: Masked     Mouth/Throat:     Comments: Masked  Eyes:     Extraocular Movements: Extraocular movements intact.     Conjunctiva/sclera: Conjunctivae normal.     Pupils: Pupils are equal, round, and reactive to light.  Cardiovascular:     Rate and Rhythm: Normal rate and regular rhythm.     Pulses: Normal pulses.     Heart sounds: Normal heart sounds.  Pulmonary:     Effort: Pulmonary effort is normal.     Breath sounds: Normal breath sounds.  Chest:  Breasts:    Right: Normal. No swelling, bleeding, inverted nipple, mass or nipple discharge.     Left: Normal. No swelling, bleeding, inverted nipple, mass or nipple discharge.  Abdominal:     General: Abdomen is flat. Bowel sounds are normal.      Palpations: Abdomen is soft.  Genitourinary:    Comments: Deferred  as per patient Musculoskeletal:        General: Normal range of motion.     Cervical back: Normal range of motion and neck supple.  Skin:    General: Skin is warm.     Comments: Located on LUQ, over ribs-1.75x2" soft nontender. Suggestive of lipoma.  Neurological:     General: No focal deficit present.     Mental Status: He is alert.  Psychiatric:        Mood and Affect: Mood normal.        Behavior: Behavior normal.     Assessment And Plan:     1. Routine general medical examination at a health care facility Comments: A  full exam was performed. DRE deferred, as per patient. PATIENT IS ADVISED TO GET 30-45 MINUTES REGULAR EXERCISE NO LESS THAN FOUR TO FIVE DAYS PER WEEK - BOTH WEIGHTBEARING EXERCISES AND AEROBIC ARE RECOMMENDED.  PATIENT IS ADVISED TO FOLLOW A HEALTHY DIET WITH AT LEAST SIX FRUITS/VEGGIES PER DAY, DECREASE INTAKE OF RED MEAT, AND TO INCREASE FISH INTAKE TO TWO DAYS PER WEEK.  MEATS/FISH SHOULD NOT BE FRIED, BAKED OR BROILED IS PREFERABLE.  IT IS ALSO IMPORTANT TO CUT BACK ON YOUR SUGAR INTAKE. PLEASE AVOID ANYTHING WITH ADDED SUGAR, CORN SYRUP OR OTHER SWEETENERS. IF YOU MUST USE A SWEETENER, YOU CAN TRY STEVIA. IT IS ALSO IMPORTANT TO AVOID ARTIFICIALLY SWEETENERS AND DIET BEVERAGES. LASTLY, I SUGGEST WEARING SPF 50 SUNSCREEN ON EXPOSED PARTS AND ESPECIALLY WHEN IN THE DIRECT SUNLIGHT FOR AN EXTENDED PERIOD OF TIME.  PLEASE AVOID FAST FOOD RESTAURANTS AND INCREASE YOUR WATER INTAKE.  2. Hypertensive nephropathy Comments: Chronic, uncontrolled. He admits he did not take meds today. EKG performed, NSR w/ LVH by voltage and nonspecific ST depression. I will not make any med changes. Importance of dietary/medication compliance was again stressed to the patient.  - POCT Urinalysis Dipstick (81002) - EKG 12-Lead - Microalbumin / Creatinine Urine Ratio - CBC - CMP14+EGFR - Lipid panel  3. Chronic renal  disease, stage II Comments: Chronic, advised to stay well hydrated, keep BP controlled and avoid NSAIDs to decrease progression of CKD.   4. Other abnormal glucose Comments: His a1c has been elevated in the past. I will recheck this today. He is encouraged to limit his intake of sweetened beverages, including diet drinks.  - Hemoglobin A1c  5. Hyperthyroidism Comments: Newly diagnosed, Endo input appreciated, notes reviewed. He will c/w methimazole. No need to recheck thyroid labs today.   6. Soft tissue mass Comments: This is suggestive of lipoma, likely more apparent with recent weight loss. Will refer to Surgery if continues to grow in size.   7. Need for vaccination Comments: He was given Prevnar-20 IM x 1. - Pneumococcal conjugate vaccine 20-valent (Prevnar 20)   Patient was given opportunity to ask questions. Patient verbalized understanding of the plan and was able to repeat key elements of the plan. All questions were answered to their satisfaction.   I, Maximino Greenland, MD, have reviewed all documentation for this visit. The documentation on 04/24/21 for the exam, diagnosis, procedures, and orders are all accurate and complete.   THE PATIENT IS ENCOURAGED TO PRACTICE SOCIAL DISTANCING DUE TO THE COVID-19 PANDEMIC.

## 2021-04-24 NOTE — Patient Instructions (Signed)
Health Maintenance After Age 77 After age 77, you are at a higher risk for certain long-term diseases and infections as well as injuries from falls. Falls are a major cause of broken bones and head injuries in people who are older than age 77. Getting regular preventive care can help to keep you healthy and well. Preventive care includes getting regular testing and making lifestyle changes as recommended by your health care provider. Talk with your health care provider about: Which screenings and tests you should have. A screening is a test that checks for a disease when you have no symptoms. A diet and exercise plan that is right for you. What should I know about screenings and tests to prevent falls? Screening and testing are the best ways to find a health problem early. Early diagnosis and treatment give you the best chance of managing medical conditions that are common after age 77. Certain conditions and lifestyle choices may make you more likely to have a fall. Your health care provider may recommend: Regular vision checks. Poor vision and conditions such as cataracts can make you more likely to have a fall. If you wear glasses, make sure to get your prescription updated if your vision changes. Medicine review. Work with your health care provider to regularly review all of the medicines you are taking, including over-the-counter medicines. Ask your health care provider about any side effects that may make you more likely to have a fall. Tell your health care provider if any medicines that you take make you feel dizzy or sleepy. Strength and balance checks. Your health care provider may recommend certain tests to check your strength and balance while standing, walking, or changing positions. Foot health exam. Foot pain and numbness, as well as not wearing proper footwear, can make you more likely to have a fall. Screenings, including: Osteoporosis screening. Osteoporosis is a condition that causes  the bones to get weaker and break more easily. Blood pressure screening. Blood pressure changes and medicines to control blood pressure can make you feel dizzy. Depression screening. You may be more likely to have a fall if you have a fear of falling, feel depressed, or feel unable to do activities that you used to do. Alcohol use screening. Using too much alcohol can affect your balance and may make you more likely to have a fall. Follow these instructions at home: Lifestyle Do not drink alcohol if: Your health care provider tells you not to drink. If you drink alcohol: Limit how much you have to: 0-1 drink a day for women. 0-2 drinks a day for men. Know how much alcohol is in your drink. In the [place] one drink equals one 12 oz bottle of beer (355 mL), one 5 oz glass of wine (148 mL), or one 1 oz glass of hard liquor (44 mL). Do not use any products that contain nicotine or tobacco. These products include cigarettes, chewing tobacco, and vaping devices, such as e-cigarettes. If you need help quitting, ask your health care provider. Activity  Follow a regular exercise program to stay fit. This will help you maintain your balance. Ask your health care provider what types of exercise are appropriate for you. If you need a cane or walker, use it as recommended by your health care provider. Wear supportive shoes that have nonskid soles. Safety  Remove any tripping hazards, such as rugs, cords, and clutter. Install safety equipment such as grab bars in bathrooms and safety rails on stairs. Keep rooms and walkways   well-lit. General instructions Talk with your health care provider about your risks for falling. Tell your health care provider if: You fall. Be sure to tell your health care provider about all falls, even ones that seem minor. You feel dizzy, tiredness (fatigue), or off-balance. Take over-the-counter and prescription medicines only as told by your health care provider. These include  supplements. Eat a healthy diet and maintain a healthy weight. A healthy diet includes low-fat dairy products, low-fat (lean) meats, and fiber from whole grains, beans, and lots of fruits and vegetables. Stay current with your vaccines. Schedule regular health, dental, and eye exams. Summary Having a healthy lifestyle and getting preventive care can help to protect your health and wellness after age 77. Screening and testing are the best way to find a health problem early and help you avoid having a fall. Early diagnosis and treatment give you the best chance for managing medical conditions that are more common for people who are older than age 77. Falls are a major cause of broken bones and head injuries in people who are older than age 77. Take precautions to prevent a fall at home. Work with your health care provider to learn what changes you can make to improve your health and wellness and to prevent falls. This information is not intended to replace advice given to you by your health care provider. Make sure you discuss any questions you have with your health care provider. Document Revised: 07/17/2020 Document Reviewed: 07/17/2020 Elsevier Patient Education  2022 Elsevier Inc.  

## 2021-04-25 LAB — MICROALBUMIN / CREATININE URINE RATIO
Creatinine, Urine: 145.1 mg/dL
Microalb/Creat Ratio: 7 mg/g creat (ref 0–29)
Microalbumin, Urine: 10.3 ug/mL

## 2021-04-29 LAB — ALKALINE PHOSPHATASE, ISOENZYMES
Alkaline Phosphatase: 209 IU/L — ABNORMAL HIGH (ref 44–121)
BONE FRACTION: 48 % (ref 12–68)
INTESTINAL FRAC.: 26 % — ABNORMAL HIGH (ref 0–18)
LIVER FRACTION: 26 % (ref 13–88)

## 2021-04-29 LAB — SPECIMEN STATUS REPORT

## 2021-05-09 ENCOUNTER — Ambulatory Visit (INDEPENDENT_AMBULATORY_CARE_PROVIDER_SITE_OTHER): Payer: Medicare Other

## 2021-05-09 ENCOUNTER — Ambulatory Visit: Payer: Medicare Other | Admitting: Internal Medicine

## 2021-05-09 VITALS — Ht 72.0 in | Wt 170.0 lb

## 2021-05-09 DIAGNOSIS — Z0000 Encounter for general adult medical examination without abnormal findings: Secondary | ICD-10-CM | POA: Diagnosis not present

## 2021-05-09 NOTE — Patient Instructions (Signed)
[name] , Thank you for taking time to come for your Medicare Wellness Visit. I appreciate your ongoing commitment to your health goals. Please review the following plan we discussed and let me know if I can assist you in the future.   Screening recommendations/referrals: Colonoscopy: not required Recommended yearly ophthalmology/optometry visit for glaucoma screening and checkup Recommended yearly dental visit for hygiene and checkup  Vaccinations: Influenza vaccine: decline Pneumococcal vaccine: completed 04/24/2021 Tdap vaccine: completed 12/09/2011, due 12/08/2021 Shingles vaccine: needs second dose   Covid-19:  01/24/2021, 12/28/2019, 06/29/2019, 06/03/2019  Advanced directives: Please bring a copy of your POA (Power of Attorney) and/or Living Will to your next appointment.   Conditions/risks identified: none  Next appointment: Follow up in one year for your annual wellness visit.   Preventive Care 20 Years and Older, Male Preventive care refers to lifestyle choices and visits with your health care provider that can promote health and wellness. What does preventive care include? A yearly physical exam. This is also called an annual well check. Dental exams once or twice a year. Routine eye exams. Ask your health care provider how often you should have your eyes checked. Personal lifestyle choices, including: Daily care of your teeth and gums. Regular physical activity. Eating a healthy diet. Avoiding tobacco and drug use. Limiting alcohol use. Practicing safe sex. Taking low doses of aspirin every day. Taking vitamin and mineral supplements as recommended by your health care provider. What happens during an annual well check? The services and screenings done by your health care provider during your annual well check will depend on your age, overall health, lifestyle risk factors, and family history of disease. Counseling  Your health care provider may ask you questions about  your: Alcohol use. Tobacco use. Drug use. Emotional well-being. Home and relationship well-being. Sexual activity. Eating habits. History of falls. Memory and ability to understand (cognition). Work and work Statistician. Screening  You may have the following tests or measurements: Height, weight, and BMI. Blood pressure. Lipid and cholesterol levels. These may be checked every 5 years, or more frequently if you are over 28 years old. Skin check. Lung cancer screening. You may have this screening every year starting at age 90 if you have a 30-pack-year history of smoking and currently smoke or have quit within the past 15 years. Fecal occult blood test (FOBT) of the stool. You may have this test every year starting at age 61. Flexible sigmoidoscopy or colonoscopy. You may have a sigmoidoscopy every 5 years or a colonoscopy every 10 years starting at age 57. Prostate cancer screening. Recommendations will vary depending on your family history and other risks. Hepatitis C blood test. Hepatitis B blood test. Sexually transmitted disease (STD) testing. Diabetes screening. This is done by checking your blood sugar (glucose) after you have not eaten for a while (fasting). You may have this done every 1-3 years. Abdominal aortic aneurysm (AAA) screening. You may need this if you are a current or former smoker. Osteoporosis. You may be screened starting at age 60 if you are at high risk. Talk with your health care provider about your test results, treatment options, and if necessary, the need for more tests. Vaccines  Your health care provider may recommend certain vaccines, such as: Influenza vaccine. This is recommended every year. Tetanus, diphtheria, and acellular pertussis (Tdap, Td) vaccine. You may need a Td booster every 10 years. Zoster vaccine. You may need this after age 68. Pneumococcal 13-valent conjugate (PCV13) vaccine. One dose  is recommended after age 75. Pneumococcal  polysaccharide (PPSV23) vaccine. One dose is recommended after age 41. Talk to your health care provider about which screenings and vaccines you need and how often you need them. This information is not intended to replace advice given to you by your health care provider. Make sure you discuss any questions you have with your health care provider. Document Released: 03/24/2015 Document Revised: 11/15/2015 Document Reviewed: 12/27/2014 Elsevier Interactive Patient Education  2017 [place] Prevention in the Home Falls can cause injuries. They can happen to people of all ages. There are many things you can do to make your home safe and to help prevent falls. What can I do on the outside of my home? Regularly fix the edges of walkways and driveways and fix any cracks. Remove anything that might make you trip as you walk through a door, such as a raised step or threshold. Trim any bushes or trees on the path to your home. Use bright outdoor lighting. Clear any walking paths of anything that might make someone trip, such as rocks or tools. Regularly check to see if handrails are loose or broken. Make sure that both sides of any steps have handrails. Any raised decks and porches should have guardrails on the edges. Have any leaves, snow, or ice cleared regularly. Use sand or salt on walking paths during winter. Clean up any spills in your garage right away. This includes oil or grease spills. What can I do in the bathroom? Use night lights. Install grab bars by the toilet and in the tub and shower. Do not use towel bars as grab bars. Use non-skid mats or decals in the tub or shower. If you need to sit down in the shower, use a plastic, non-slip stool. Keep the floor dry. Clean up any water that spills on the floor as soon as it happens. Remove soap buildup in the tub or shower regularly. Attach bath mats securely with double-sided non-slip rug tape. Do not have throw rugs and other  things on the floor that can make you trip. What can I do in the bedroom? Use night lights. Make sure that you have a light by your bed that is easy to reach. Do not use any sheets or blankets that are too big for your bed. They should not hang down onto the floor. Have a firm chair that has side arms. You can use this for support while you get dressed. Do not have throw rugs and other things on the floor that can make you trip. What can I do in the kitchen? Clean up any spills right away. Avoid walking on wet floors. Keep items that you use a lot in easy-to-reach places. If you need to reach something above you, use a strong step stool that has a grab bar. Keep electrical cords out of the way. Do not use floor polish or wax that makes floors slippery. If you must use wax, use non-skid floor wax. Do not have throw rugs and other things on the floor that can make you trip. What can I do with my stairs? Do not leave any items on the stairs. Make sure that there are handrails on both sides of the stairs and use them. Fix handrails that are broken or loose. Make sure that handrails are as long as the stairways. Check any carpeting to make sure that it is firmly attached to the stairs. Fix any carpet that is loose or worn. Avoid  having throw rugs at the top or bottom of the stairs. If you do have throw rugs, attach them to the floor with carpet tape. Make sure that you have a light switch at the top of the stairs and the bottom of the stairs. If you do not have them, ask someone to add them for you. What else can I do to help prevent falls? Wear shoes that: Do not have high heels. Have rubber bottoms. Are comfortable and fit you well. Are closed at the toe. Do not wear sandals. If you use a stepladder: Make sure that it is fully opened. Do not climb a closed stepladder. Make sure that both sides of the stepladder are locked into place. Ask someone to hold it for you, if possible. Clearly  mark and make sure that you can see: Any grab bars or handrails. First and last steps. Where the edge of each step is. Use tools that help you move around (mobility aids) if they are needed. These include: Canes. Walkers. Scooters. Crutches. Turn on the lights when you go into a dark area. Replace any light bulbs as soon as they burn out. Set up your furniture so you have a clear path. Avoid moving your furniture around. If any of your floors are uneven, fix them. If there are any pets around you, be aware of where they are. Review your medicines with your doctor. Some medicines can make you feel dizzy. This can increase your chance of falling. Ask your doctor what other things that you can do to help prevent falls. This information is not intended to replace advice given to you by your health care provider. Make sure you discuss any questions you have with your health care provider. Document Released: 12/22/2008 Document Revised: 08/03/2015 Document Reviewed: 04/01/2014 Elsevier Interactive Patient Education  2017 [employer].

## 2021-05-09 NOTE — Progress Notes (Signed)
I connected with Lawrence Harrell today by telephone and verified that I am speaking with the correct person using two identifiers. Location patient: home Location provider: work Persons participating in the virtual visit: Zailen Dallaire, Glenna Durand LPN.   I discussed the limitations, risks, security and privacy concerns of performing an evaluation and management service by telephone and the availability of in person appointments. I also discussed with the patient that there may be a patient responsible charge related to this service. The patient expressed understanding and verbally consented to this telephonic visit.    Interactive audio and video telecommunications were attempted between this provider and patient, however failed, due to patient having technical difficulties OR patient did not have access to video capability.  We continued and completed visit with audio only.     Vital signs may be patient reported or missing.  Subjective:   Lawrence Harrell is a 76 y.o. male who presents for Medicare Annual/Subsequent preventive examination.  Review of Systems     Cardiac Risk Factors include: advanced age (>27men, >45 women);hypertension;male gender     Objective:    Today's Vitals   05/09/21 0856  Weight: 170 lb (77.1 kg)  Height: 6' (1.829 m)   Body mass index is 23.06 kg/m.  Advanced Directives 05/09/2021 04/27/2020 04/22/2019  Does Patient Have a Medical Advance Directive? Yes No Yes  Type of Paramedic of [place];Living will - [place];Living will  Copy of [place] in Chart? No - copy requested - No - copy requested    Current Medications (verified) Outpatient Encounter Medications as of 05/09/2021  Medication Sig   amLODipine (NORVASC) 10 MG tablet TAKE 1 TABLET BY MOUTH  DAILY   Cholecalciferol (VITAMIN D3) 2000 units TABS Take by mouth.   methimazole (TAPAZOLE) 10 MG tablet Take 1 tablet (10 mg total) by  mouth 2 (two) times daily.   metoprolol succinate (TOPROL-XL) 25 MG 24 hr tablet Take 1 tablet (25 mg total) by mouth daily.   pravastatin (PRAVACHOL) 40 MG tablet TAKE 1 TABLET BY MOUTH  DAILY   valsartan-hydrochlorothiazide (DIOVAN-HCT) 160-25 MG tablet TAKE 1 TABLET BY MOUTH  DAILY   promethazine-dextromethorphan (PROMETHAZINE-DM) 6.25-15 MG/5ML syrup Take 5 mLs by mouth 4 (four) times daily as needed for cough. (Patient not taking: Reported on 04/24/2021)   tamsulosin (FLOMAX) 0.4 MG CAPS capsule TAKE 1 CAPSULE BY MOUTH  DAILY 1/2 HOUR FOLLOWING  THE SAME MEAL (Patient not taking: Reported on 04/24/2021)   No facility-administered encounter medications on file as of 05/09/2021.    Allergies (verified) Patient has no known allergies.   History: Past Medical History:  Diagnosis Date   BPH with obstruction/lower urinary tract symptoms    High cholesterol    HTN (hypertension)    Prediabetes    Renal cell cancer, right [facility])    Past Surgical History:  Procedure Laterality Date   gland extraction  09/2005   Salivary    nephrectomy  10/06/2013   partial    Family History  Problem Relation Age of Onset   Hypertension Mother    Hypertension Father    Thyroid disease Sister    Social History   Socioeconomic History   Marital status: Married    Spouse name: Not on file   Number of children: Not on file   Years of education: Not on file   Highest education level: Not on file  Occupational History   Not on file  Tobacco Use  Smoking status: Never   Smokeless tobacco: Never  Vaping Use   Vaping Use: Never used  Substance and Sexual Activity   Alcohol use: Never   Drug use: Never   Sexual activity: Yes  Other Topics Concern   Not on file  Social History Narrative   Not on file   Social Determinants of Health   Financial Resource Strain: Low Risk    Difficulty of Paying Living Expenses: Not hard at all  Food Insecurity: No Food Insecurity   Worried About Ship broker in the Last Year: Never true   [place] in the Last Year: Never true  Transportation Needs: No Transportation Needs   Lack of Transportation (Medical): No   Lack of Transportation (Non-Medical): No  Physical Activity: Insufficiently Active   Days of Exercise per Week: 7 days   Minutes of Exercise per Session: 20 min  Stress: No Stress Concern Present   Feeling of Stress : Not at all  Social Connections: Not on file    Tobacco Counseling Counseling given: Not Answered   Clinical Intake:  Pre-visit preparation completed: Yes  Pain : No/denies pain     Nutritional Status: BMI of 19-24  Normal Nutritional Risks: None Diabetes: No  How often do you need to have someone help you when you read instructions, pamphlets, or other written materials from your doctor or pharmacy?: 1 - Never What is the last grade level you completed in school?: 83yrs college  Diabetic? no  Interpreter Needed?: No  Information entered by :: NAllen LPN   Activities of Daily Living In your present state of health, do you have any difficulty performing the following activities: 05/09/2021  Hearing? N  Vision? N  Difficulty concentrating or making decisions? N  Walking or climbing stairs? N  Dressing or bathing? N  Doing errands, shopping? N  Preparing Food and eating ? N  Using the Toilet? N  In the past six months, have you accidently leaked urine? N  Do you have problems with loss of bowel control? N  Managing your Medications? N  Managing your Finances? N  Housekeeping or managing your Housekeeping? N  Some recent data might be hidden    Patient Care Team: Glendale Chard, MD as PCP - General (Internal Medicine)  Indicate any recent Medical Services you may have received from other than [facility] providers in the past year (date may be approximate).     Assessment:   This is a routine wellness examination for Lawrence Harrell.  Hearing/Vision screen Vision Screening - Comments::  Regular eye exams, Dr. Idolina Primer  Dietary issues and exercise activities discussed: Current Exercise Habits: Home exercise routine, Type of exercise: stretching, Time (Minutes): 20, Frequency (Times/Week): 7, Weekly Exercise (Minutes/Week): 140   Goals Addressed             This Visit's Progress    Patient Stated       05/09/2021, stay healthy and stay positive       Depression Screen PHQ 2/9 Scores 05/09/2021 04/29/2021 04/27/2020 04/22/2019 01/08/2018  PHQ - 2 Score 0 0 0 0 0  PHQ- 9 Score - - - 0 -    Fall Risk Fall Risk  05/09/2021 04/27/2020 04/22/2019 01/08/2018  Falls in the past year? 0 0 0 No  Risk for fall due to : Medication side effect Medication side effect Medication side effect -  Follow up Falls evaluation completed;Education provided Falls evaluation completed;Education provided;Falls prevention discussed Falls evaluation completed;Education provided;Falls  prevention discussed -    FALL RISK PREVENTION PERTAINING TO THE HOME:  Any stairs in or around the home? Yes  If so, are there any without handrails? No  Home free of loose throw rugs in walkways, pet beds, electrical cords, etc? Yes  Adequate lighting in your home to reduce risk of falls? Yes   ASSISTIVE DEVICES UTILIZED TO PREVENT FALLS:  Life alert? No  Use of a cane, walker or w/c? No  Grab bars in the bathroom? No  Shower chair or bench in shower? No  Elevated toilet seat or a handicapped toilet? No   TIMED UP AND GO:  Was the test performed? No .      Cognitive Function:     6CIT Screen 05/09/2021 04/27/2020 04/22/2019  What Year? 0 points 0 points 0 points  What month? 0 points 0 points 0 points  What time? 0 points 0 points 0 points  Count back from 20 0 points 0 points 0 points  Months in reverse 0 points 0 points 0 points  Repeat phrase 6 points 6 points 2 points  Total Score 6 6 2     Immunizations Immunization History  Administered Date(s) Administered   PFIZER(Purple Top)SARS-COV-2  Vaccination 06/03/2019, 06/29/2019, 12/28/2019   PNEUMOCOCCAL CONJUGATE-20 04/24/2021   Pfizer Covid-19 Vaccine Bivalent Booster 26yrs & up 01/24/2021   Pneumococcal Polysaccharide-23 11/21/2011   Zoster Recombinat (Shingrix) 04/10/2021   Zoster, Live 03/11/2012    TDAP status: Up to date  Flu Vaccine status: Declined, Education has been provided regarding the importance of this vaccine but patient still declined. Advised may receive this vaccine at local pharmacy or Health Dept. Aware to provide a copy of the vaccination record if obtained from local pharmacy or Health Dept. Verbalized acceptance and understanding.  Pneumococcal vaccine status: Up to date  Covid-19 vaccine status: Information provided on how to obtain vaccines.   Qualifies for Shingles Vaccine? Yes   Zostavax completed Yes   Shingrix Completed?: need second dose  Screening Tests Health Maintenance  Topic Date Due   INFLUENZA VACCINE  06/08/2021 (Originally 10/09/2020)   Zoster Vaccines- Shingrix (2 of 2) 06/05/2021   TETANUS/TDAP  12/08/2021   Pneumonia Vaccine 2+ Years old  Completed   COVID-19 Vaccine  Completed   Hepatitis C Screening  Completed   HPV VACCINES  Aged Out   COLONOSCOPY (Pts 45-52yrs Insurance coverage will need to be confirmed)  Discontinued    Health Maintenance  There are no preventive care reminders to display for this patient.  Colorectal cancer screening: No longer required.   Lung Cancer Screening: (Low Dose CT Chest recommended if Age 43-80 years, 30 pack-year currently smoking OR have quit w/in 15years.) does not qualify.   Lung Cancer Screening Referral: no  Additional Screening:  Hepatitis C Screening: does qualify; Completed 06/18/2012  Vision Screening: Recommended annual ophthalmology exams for early detection of glaucoma and other disorders of the eye. Is the patient up to date with their annual eye exam?  Yes  Who is the provider or what is the name of the office in  which the patient attends annual eye exams? Dr. Idolina Primer If pt is not established with a provider, would they like to be referred to a provider to establish care? No .   Dental Screening: Recommended annual dental exams for proper oral hygiene  Community Resource Referral / Chronic Care Management: CRR required this visit?  No   CCM required this visit?  No  Plan:     I have personally reviewed and noted the following in the patients chart:   Medical and social history Use of alcohol, tobacco or illicit drugs  Current medications and supplements including opioid prescriptions. Patient is not currently taking opioid prescriptions. Functional ability and status Nutritional status Physical activity Advanced directives List of other physicians Hospitalizations, surgeries, and ER visits in previous 12 months Vitals Screenings to include cognitive, depression, and falls Referrals and appointments  In addition, I have reviewed and discussed with patient certain preventive protocols, quality metrics, and best practice recommendations. A written personalized care plan for preventive services as well as general preventive health recommendations were provided to patient.     Kellie Simmering, LPN   05/15/4678   Nurse Notes: none  Due to this being a virtual visit, the after visit summary with patients personalized plan was offered to patient via mail or my-chart. patient was mailed a copy of AVS.

## 2021-05-15 ENCOUNTER — Other Ambulatory Visit: Payer: Self-pay

## 2021-05-15 ENCOUNTER — Ambulatory Visit (INDEPENDENT_AMBULATORY_CARE_PROVIDER_SITE_OTHER): Payer: Medicare Other | Admitting: Endocrinology

## 2021-05-15 VITALS — BP 152/90 | HR 89 | Ht 72.0 in | Wt 171.0 lb

## 2021-05-15 DIAGNOSIS — R946 Abnormal results of thyroid function studies: Secondary | ICD-10-CM

## 2021-05-15 DIAGNOSIS — E0590 Thyrotoxicosis, unspecified without thyrotoxic crisis or storm: Secondary | ICD-10-CM | POA: Diagnosis not present

## 2021-05-15 LAB — TSH: TSH: 8.09 u[IU]/mL — ABNORMAL HIGH (ref 0.35–5.50)

## 2021-05-15 LAB — T4, FREE: Free T4: 0.4 ng/dL — ABNORMAL LOW (ref 0.60–1.60)

## 2021-05-15 MED ORDER — METHIMAZOLE 10 MG PO TABS: 10.0000 mg | ORAL_TABLET | Freq: Every day | ORAL | 1 refills | Status: DC

## 2021-05-15 NOTE — Patient Instructions (Addendum)
Blood tests are requested for you today.  We'll let you know about the results.    ?Please continue the same metoprolol.   ?If ever you have fever while taking methimazole, stop it and call us, even if the reason is obvious, because of the risk of a rare side-effect.   ?It is best to never miss the medication.  However, if you do miss it, next best is to double up the next time.   ?Please come back for a follow-up appointment in 2 months.   ?

## 2021-05-15 NOTE — Progress Notes (Signed)
? ?Subjective:  ? ? Patient ID: Lawrence Harrell, male    DOB: 1945-04-21, 76 y.o.   MRN: [id number] ? ?HPI ?Pt returns for f/u of hyperthyroidism (dx'ed 2022; tapazole was chosen as rx, due to severity; he has never had thyroid imaging).  pt states he continues to feel better in general.  He takes meds as rx'ed.   ?Past Medical History:  ?Diagnosis Date  ? BPH with obstruction/lower urinary tract symptoms   ? High cholesterol   ? HTN (hypertension)   ? Prediabetes   ? Renal cell cancer, right ([facility])   ? ? ?Past Surgical History:  ?Procedure Laterality Date  ? gland extraction  09/2005  ? Salivary   ? nephrectomy  10/06/2013  ? partial   ? ? ?Social History  ? ?Socioeconomic History  ? Marital status: Married  ?  Spouse name: Not on file  ? Number of children: Not on file  ? Years of education: Not on file  ? Highest education level: Not on file  ?Occupational History  ? Not on file  ?Tobacco Use  ? Smoking status: Never  ? Smokeless tobacco: Never  ?Vaping Use  ? Vaping Use: Never used  ?Substance and Sexual Activity  ? Alcohol use: Never  ? Drug use: Never  ? Sexual activity: Yes  ?Other Topics Concern  ? Not on file  ?Social History Narrative  ? Not on file  ? ?Social Determinants of Health  ? ?Financial Resource Strain: Low Risk   ? Difficulty of Paying Living Expenses: Not hard at all  ?Food Insecurity: No Food Insecurity  ? Worried About Charity fundraiser in the Last Year: Never true  ? Ran Out of Food in the Last Year: Never true  ?Transportation Needs: No Transportation Needs  ? Lack of Transportation (Medical): No  ? Lack of Transportation (Non-Medical): No  ?Physical Activity: Insufficiently Active  ? Days of Exercise per Week: 7 days  ? Minutes of Exercise per Session: 20 min  ?Stress: No Stress Concern Present  ? Feeling of Stress : Not at all  ?Social Connections: Not on file  ?Intimate Partner Violence: Not on file  ? ? ?Current Outpatient Medications on File Prior to Visit  ?Medication Sig Dispense  Refill  ? amLODipine (NORVASC) 10 MG tablet TAKE 1 TABLET BY MOUTH  DAILY 90 tablet 3  ? Cholecalciferol (VITAMIN D3) 2000 units TABS Take by mouth.    ? metoprolol succinate (TOPROL-XL) 25 MG 24 hr tablet Take 1 tablet (25 mg total) by mouth daily. 90 tablet 3  ? pravastatin (PRAVACHOL) 40 MG tablet TAKE 1 TABLET BY MOUTH  DAILY 90 tablet 3  ? promethazine-dextromethorphan (PROMETHAZINE-DM) 6.25-15 MG/5ML syrup Take 5 mLs by mouth 4 (four) times daily as needed for cough. 118 mL 0  ? tamsulosin (FLOMAX) 0.4 MG CAPS capsule TAKE 1 CAPSULE BY MOUTH  DAILY 1/2 HOUR FOLLOWING  THE SAME MEAL 90 capsule 3  ? valsartan-hydrochlorothiazide (DIOVAN-HCT) 160-25 MG tablet TAKE 1 TABLET BY MOUTH  DAILY 90 tablet 3  ? ?No current facility-administered medications on file prior to visit.  ? ? ?No Known Allergies ? ?Family History  ?Problem Relation Age of Onset  ? Hypertension Mother   ? Hypertension Father   ? Thyroid disease Sister   ? ? ?BP (!) 152/90   Pulse 89   Ht 6' (1.829 m)   Wt 171 lb (77.6 kg)   SpO2 97%   BMI 23.19 kg/m?  ? ? ?Review  of Systems ?Denies fever.   ?   ?Objective:  ? Physical Exam ?VITAL SIGNS:  See vs page ?GENERAL: no distress ?NECK: There is no palpable thyroid enlargement.  No thyroid nodule is palpable.  No palpable lymphadenopathy at the anterior neck.   ? ? ?Lab Results  ?Component Value Date  ? TSH 8.09 (H) 05/15/2021  ? ?   ?Assessment & Plan:  ?Hyperthyroidism: overcontrolled.  Skip 1 week of tapazole, then resume at 10/d ? ?

## 2021-07-17 ENCOUNTER — Ambulatory Visit: Payer: Medicare Other | Admitting: Endocrinology

## 2021-07-23 ENCOUNTER — Encounter: Payer: Self-pay | Admitting: Internal Medicine

## 2021-07-23 ENCOUNTER — Ambulatory Visit: Payer: Medicare Other | Admitting: Internal Medicine

## 2021-07-23 VITALS — BP 148/100 | HR 77 | Ht 72.0 in | Wt 174.4 lb

## 2021-07-23 DIAGNOSIS — E0590 Thyrotoxicosis, unspecified without thyrotoxic crisis or storm: Secondary | ICD-10-CM | POA: Diagnosis not present

## 2021-07-23 DIAGNOSIS — R946 Abnormal results of thyroid function studies: Secondary | ICD-10-CM | POA: Diagnosis not present

## 2021-07-23 LAB — TSH: TSH: 10.13 u[IU]/mL — ABNORMAL HIGH (ref 0.35–5.50)

## 2021-07-23 LAB — T3, FREE: T3, Free: 3.3 pg/mL (ref 2.3–4.2)

## 2021-07-23 LAB — T4, FREE: Free T4: 0.62 ng/dL (ref 0.60–1.60)

## 2021-07-23 NOTE — Patient Instructions (Signed)
For now, continue: ?- Methimazole 10 mg daily  ?- Toprol XL 25 mg daily ? ?Please stop at the lab. ? ?Please return in 4 months, but for labs sooner. ? ?

## 2021-07-23 NOTE — Progress Notes (Addendum)
Patient ID: Lawrence Harrell, male   DOB: 09/11/1945, 76 y.o.   MRN: [id number]  This visit occurred during the SARS-CoV-2 public health emergency.  Safety protocols were in place, including screening questions prior to the visit, additional usage of staff PPE, and extensive cleaning of exam room while observing appropriate contact time as indicated for disinfecting solutions.   HPI  Lawrence Harrell is a 76 y.o.-year-old male, returning for follow-up for thyrotoxicosis, diagnosed in 2022, with presumed diagnosis of Graves' disease.  He was previously seen by Dr. Loanne Drilling, last visit 2 months ago.  Interim hx.: Since last visit with Dr. Loanne Drilling, patient feels well, without complaints.  He gained his weight back after his diagnosis of Graves' disease and starting treatment.  Reviewed history: He was diagnosed with thyrotoxicosis in 12/2020.   Before dx., he lost weight - 30 lbs (from 170-173 lbs) in 2 months.  He is currently on: - Methimazole 10 mg daily (dose reduced from 10 mg 2x in 05/2021) - Toprol XL 25 mg daily  I reviewed pt's thyroid tests: Lab Results  Component Value Date   TSH 8.09 (H) 05/15/2021   TSH 0.01 (L) 04/02/2021   TSH <0.01 02/13/2021   TSH <0.005 (L) 12/19/2020   FREET4 0.40 (L) 05/15/2021   FREET4 0.64 04/02/2021   FREET4 1.94 (H) 02/13/2021   FREET4 3.28 (H) 12/19/2020   T3FREE 10.2 (H) 12/19/2020   Antithyroid antibodies: No results found for: TSI  Pt denies: - feeling nodules in neck - hoarseness - dysphagia - choking - SOB with lying down  Since last visit, he denies: - fatigue - excessive sweating/heat intolerance - tremors - anxiety - palpitations - hyperdefecation - weight loss - had recent weight gain  Pt does have a FH of thyroid ds.in sister, niece. No FH of thyroid cancer. No h/o radiation tx to head or neck. No recent contrast studies. No steroid use. No herbal supplements. No Biotin use.  Pt. also has a history of prediabetes, HTN, HL,  history of renal cell carcinoma.  ROS: Constitutional: + see HPI  Past Medical History:  Diagnosis Date   BPH with obstruction/lower urinary tract symptoms    High cholesterol    HTN (hypertension)    Prediabetes    Renal cell cancer, right [facility])    Past Surgical History:  Procedure Laterality Date   gland extraction  09/2005   Salivary    nephrectomy  10/06/2013   partial    Social History   Socioeconomic History   Marital status: Married    Spouse name: Not on file   Number of children: Not on file   Years of education: Not on file   Highest education level: Not on file  Occupational History   Not on file  Tobacco Use   Smoking status: Never   Smokeless tobacco: Never  Vaping Use   Vaping Use: Never used  Substance and Sexual Activity   Alcohol use: Never   Drug use: Never   Sexual activity: Yes  Other Topics Concern   Not on file  Social History Narrative   Not on file   Social Determinants of Health   Financial Resource Strain: Low Risk    Difficulty of Paying Living Expenses: Not hard at all  Food Insecurity: No Food Insecurity   Worried About Charity fundraiser in the Last Year: Never true   Ran Out of Food in the Last Year: Never true  Transportation Needs: No Transportation Needs   Lack  of Transportation (Medical): No   Lack of Transportation (Non-Medical): No  Physical Activity: Insufficiently Active   Days of Exercise per Week: 7 days   Minutes of Exercise per Session: 20 min  Stress: No Stress Concern Present   Feeling of Stress : Not at all  Social Connections: Not on file  Intimate Partner Violence: Not on file   Current Outpatient Medications on File Prior to Visit  Medication Sig Dispense Refill   amLODipine (NORVASC) 10 MG tablet TAKE 1 TABLET BY MOUTH  DAILY 90 tablet 3   Cholecalciferol (VITAMIN D3) 2000 units TABS Take by mouth.     methimazole (TAPAZOLE) 10 MG tablet Take 1 tablet (10 mg total) by mouth daily. 90 tablet 1    metoprolol succinate (TOPROL-XL) 25 MG 24 hr tablet Take 1 tablet (25 mg total) by mouth daily. 90 tablet 3   pravastatin (PRAVACHOL) 40 MG tablet TAKE 1 TABLET BY MOUTH  DAILY 90 tablet 3   promethazine-dextromethorphan (PROMETHAZINE-DM) 6.25-15 MG/5ML syrup Take 5 mLs by mouth 4 (four) times daily as needed for cough. 118 mL 0   tamsulosin (FLOMAX) 0.4 MG CAPS capsule TAKE 1 CAPSULE BY MOUTH  DAILY 1/2 HOUR FOLLOWING  THE SAME MEAL 90 capsule 3   valsartan-hydrochlorothiazide (DIOVAN-HCT) 160-25 MG tablet TAKE 1 TABLET BY MOUTH  DAILY 90 tablet 3   No current facility-administered medications on file prior to visit.   No Known Allergies Family History  Problem Relation Age of Onset   Hypertension Mother    Hypertension Father    Thyroid disease Sister    PE: BP (!) 148/100 (BP Location: Left Arm, Patient Position: Sitting, Cuff Size: Normal)   Pulse 77   Ht 6' (1.829 m)   Wt 174 lb 6.4 oz (79.1 kg)   SpO2 98%   BMI 23.65 kg/m  Wt Readings from Last 3 Encounters:  07/23/21 174 lb 6.4 oz (79.1 kg)  05/15/21 171 lb (77.6 kg)  05/09/21 170 lb (77.1 kg)   Constitutional: overweight, in NAD Eyes: PERRLA, EOMI, no exophthalmos, no lid lag, no stare ENT: moist mucous membranes, no thyromegaly, no thyroid bruits, no cervical lymphadenopathy Cardiovascular: RRR, No MRG Respiratory: CTA B Musculoskeletal: no deformities, strength intact in all 4 Skin: moist, warm, no rashes Neurological: no tremor with outstretched hands, DTR normal in all 4  ASSESSMENT: 1. Thyrotoxicosis, likely Graves' disease  PLAN:  1. Patient with h/o low TSH with high free thyroid hormones, with thyrotoxic sxs: weight loss, heat intolerance, hyperdefecation, palpitations, anxiety.  - he does not appear to have exogenous causes for the low TSH.  - We discussed that possible causes of thyrotoxicosis are:  Graves ds, which is our working diagnosis Thyroiditis toxic multinodular goiter/ toxic adenoma (I cannot  feel nodules at palpation of his thyroid). - will check the TSH, fT3 and fT4 and also add thyroid stimulating antibodies to screen for Graves' disease.  - If the tests remain abnormal and TSI antibodies are not elevated, we may need an uptake and scan to differentiate between the 3 above possible etiologies  - we discussed about possible modalities of treatment for the above conditions, to include methimazole use, radioactive iodine ablation or (last resort) surgery.   For now, he is now doing well on methimazole so we will continue this -10 mg daily.  We will continue to adjust the dose based on results. - He is on a beta blocker -Toprol-XL 25 mg daily - no tachycardia or tremors on exam.  For now we will continue this, but plan to taper it down to half in the near future. - no signs of Graves' ophthalmopathy: he does not have any double vision, blurry vision, eye pain, chemosis. - RTC in 4 months, but likely sooner for repeat labs  Component     Latest Ref Rng 07/23/2021  TSH     0.35 - 5.50 uIU/mL 10.13 (H)   T4,Free(Direct)     0.60 - 1.60 ng/dL 0.62   Triiodothyronine,Free,Serum     2.3 - 4.2 pg/mL 3.3     TSH is still quite elevated, despite the reduction in methimazole dose.  We will need to go ahead and stop methimazole and recheck his thyroid tests in 1.5 months.  Component     Latest Ref Rng 07/23/2021  TSI     <140 % baseline 500 (H)     TSIs are elevated. The above labs are consistent with overtreated Graves' disease.  We may need to restart methimazole when the results come back, but at a lower dose.  Philemon Kingdom, MD PhD [facility] Endocrinology

## 2021-07-26 LAB — THYROID STIMULATING IMMUNOGLOBULIN: TSI: 500 % baseline — ABNORMAL HIGH (ref <140)

## 2021-08-27 ENCOUNTER — Encounter: Payer: Self-pay | Admitting: Internal Medicine

## 2021-08-27 ENCOUNTER — Ambulatory Visit (INDEPENDENT_AMBULATORY_CARE_PROVIDER_SITE_OTHER): Payer: Medicare Other | Admitting: Internal Medicine

## 2021-08-27 VITALS — BP 160/88 | HR 77 | Temp 97.9°F | Ht 72.0 in | Wt 171.8 lb

## 2021-08-27 DIAGNOSIS — R7309 Other abnormal glucose: Secondary | ICD-10-CM | POA: Diagnosis not present

## 2021-08-27 DIAGNOSIS — N182 Chronic kidney disease, stage 2 (mild): Secondary | ICD-10-CM | POA: Diagnosis not present

## 2021-08-27 DIAGNOSIS — I129 Hypertensive chronic kidney disease with stage 1 through stage 4 chronic kidney disease, or unspecified chronic kidney disease: Secondary | ICD-10-CM

## 2021-08-27 DIAGNOSIS — Z23 Encounter for immunization: Secondary | ICD-10-CM

## 2021-08-27 DIAGNOSIS — E559 Vitamin D deficiency, unspecified: Secondary | ICD-10-CM

## 2021-08-27 DIAGNOSIS — R748 Abnormal levels of other serum enzymes: Secondary | ICD-10-CM | POA: Diagnosis not present

## 2021-08-27 DIAGNOSIS — Z6823 Body mass index (BMI) 23.0-23.9, adult: Secondary | ICD-10-CM

## 2021-08-27 NOTE — Progress Notes (Unsigned)
Rich Brave [place]leton,acting as a Education administrator for Maximino Greenland, MD.,have documented all relevant documentation on the behalf of Maximino Greenland, MD,as directed by  Maximino Greenland, MD while in the presence of Maximino Greenland, MD.  This visit occurred during the SARS-CoV-2 public health emergency.  Safety protocols were in place, including screening questions prior to the visit, additional usage of staff PPE, and extensive cleaning of exam room while observing appropriate contact time as indicated for disinfecting solutions.  Subjective:     Patient ID: Lawrence Harrell , male    DOB: 09-30-1945 , 76 y.o.   MRN: [id number]   Chief Complaint  Patient presents with   Hypertension    HPI  The patient is here today for a follow-up on HTN and hyperlipidemia. The patient reports compliance with his medications. He denies headaches, chest pain and shortness of breath.   Hypertension This is a chronic problem. The current episode started more than 1 year ago. The problem has been gradually improving since onset. The problem is controlled. Pertinent negatives include no blurred vision, chest pain, palpitations or shortness of breath. Risk factors for coronary artery disease include dyslipidemia and male gender. The current treatment provides moderate improvement. Hypertensive end-organ damage includes kidney disease.     Past Medical History:  Diagnosis Date   BPH with obstruction/lower urinary tract symptoms    High cholesterol    HTN (hypertension)    Prediabetes    Renal cell cancer, right ([place])      Family History  Problem Relation Age of Onset   Hypertension Mother    Hypertension Father    Thyroid disease Sister      Current Outpatient Medications:    amLODipine (NORVASC) 10 MG tablet, TAKE 1 TABLET BY MOUTH  DAILY, Disp: 90 tablet, Rfl: 3   Cholecalciferol (VITAMIN D3) 2000 units TABS, Take by mouth., Disp: , Rfl:    metoprolol succinate (TOPROL-XL) 25 MG 24 hr tablet, Take 1 tablet  (25 mg total) by mouth daily., Disp: 90 tablet, Rfl: 3   pravastatin (PRAVACHOL) 40 MG tablet, TAKE 1 TABLET BY MOUTH  DAILY, Disp: 90 tablet, Rfl: 3   valsartan-hydrochlorothiazide (DIOVAN-HCT) 160-25 MG tablet, TAKE 1 TABLET BY MOUTH  DAILY, Disp: 90 tablet, Rfl: 3   No Known Allergies   Review of Systems  Constitutional: Negative.   Eyes: Negative.  Negative for blurred vision.  Respiratory: Negative.  Negative for shortness of breath.   Cardiovascular: Negative.  Negative for chest pain and palpitations.  Gastrointestinal: Negative.   Neurological: Negative.   Psychiatric/Behavioral: Negative.       Today's Vitals   08/27/21 0839 08/27/21 0919  BP: 140/72 (!) 160/88  Pulse: 77   Temp: 97.9 F (36.6 C)   Weight: 171 lb 12.8 oz (77.9 kg)   Height: 6' (1.829 m)   PainSc: 0-No pain    Body mass index is 23.3 kg/m.  Wt Readings from Last 3 Encounters:  08/27/21 171 lb 12.8 oz (77.9 kg)  07/23/21 174 lb 6.4 oz (79.1 kg)  05/15/21 171 lb (77.6 kg)    Objective:  Physical Exam Vitals and nursing note reviewed.  Constitutional:      Appearance: Normal appearance.  Eyes:     Extraocular Movements: Extraocular movements intact.  Cardiovascular:     Rate and Rhythm: Normal rate and regular rhythm.     Heart sounds: Normal heart sounds.  Pulmonary:     Effort: Pulmonary effort is normal.  Breath sounds: Normal breath sounds.  Musculoskeletal:     Cervical back: Normal range of motion.  Skin:    General: Skin is warm.  Neurological:     General: No focal deficit present.     Mental Status: He is alert.  Psychiatric:        Mood and Affect: Mood normal.      Assessment And Plan:     1. Hypertensive nephropathy Comments: Chronic, fair control.  Uncontrolled, he is advised to take valsartan/hctz in AM and amlodipine at night.  - CMP14+EGFR  2. Chronic renal disease, stage II Comments: Chronic, I will check renal function today. Encouraged to stay well hydrated,  avoid NSAIDs and keep BP well controlled to decrease CKD progression.  - CMP14+EGFR  3. Elevated alkaline phosphatase level Comments: I will check alk phos isoenzymes today.  - Alkaline Phosphatase, Isoenzymes  4. Other abnormal glucose - Hemoglobin A1c  5. Vitamin D insufficiency Comments: I will check a vitamin D level and supplement as needed.  - Vitamin D (25 hydroxy)  6. BMI 23.0-23.9, adult  7. Immunization due - Varicella-zoster vaccine IM (Shingrix)   Patient was given opportunity to ask questions. Patient verbalized understanding of the plan and was able to repeat key elements of the plan. All questions were answered to their satisfaction.   I, Maximino Greenland, MD, have reviewed all documentation for this visit. The documentation on 08/27/21 for the exam, diagnosis, procedures, and orders are all accurate and complete.   IF YOU HAVE BEEN REFERRED TO A SPECIALIST, IT MAY TAKE 1-2 WEEKS TO SCHEDULE/PROCESS THE REFERRAL. IF YOU HAVE NOT HEARD FROM US/SPECIALIST IN TWO WEEKS, PLEASE GIVE [place] A CALL AT [phone].   THE PATIENT IS ENCOURAGED TO PRACTICE SOCIAL DISTANCING DUE TO THE COVID-19 PANDEMIC.

## 2021-08-27 NOTE — Patient Instructions (Addendum)
Take Valsartan/hctz in AM Take amlodipine in PM  Hypertension, Adult Hypertension is another name for high blood pressure. High blood pressure forces your heart to work harder to pump blood. This can cause problems over time. There are two numbers in a blood pressure reading. There is a top number (systolic) over a bottom number (diastolic). It is best to have a blood pressure that is below 120/80. What are the causes? The cause of this condition is not known. Some other conditions can lead to high blood pressure. What increases the risk? Some lifestyle factors can make you more likely to develop high blood pressure: Smoking. Not getting enough exercise or physical activity. Being overweight. Having too much fat, sugar, calories, or salt (sodium) in your diet. Drinking too much alcohol. Other risk factors include: Having any of these conditions: Heart disease. Diabetes. High cholesterol. Kidney disease. Obstructive sleep apnea. Having a family history of high blood pressure and high cholesterol. Age. The risk increases with age. Stress. What are the signs or symptoms? High blood pressure may not cause symptoms. Very high blood pressure (hypertensive crisis) may cause: Headache. Fast or uneven heartbeats (palpitations). Shortness of breath. Nosebleed. Vomiting or feeling like you may vomit (nauseous). Changes in how you see. Very bad chest pain. Feeling dizzy. Seizures. How is this treated? This condition is treated by making healthy lifestyle changes, such as: Eating healthy foods. Exercising more. Drinking less alcohol. Your doctor may prescribe medicine if lifestyle changes do not help enough and if: Your top number is above 130. Your bottom number is above 80. Your personal target blood pressure may vary. Follow these instructions at home: Eating and drinking  If told, follow the DASH eating plan. To follow this plan: Fill one half of your plate at each meal with  fruits and vegetables. Fill one fourth of your plate at each meal with whole grains. Whole grains include whole-wheat pasta, brown rice, and whole-grain bread. Eat or drink low-fat dairy products, such as skim milk or low-fat yogurt. Fill one fourth of your plate at each meal with low-fat (lean) proteins. Low-fat proteins include fish, chicken without skin, eggs, beans, and tofu. Avoid fatty meat, cured and processed meat, or chicken with skin. Avoid pre-made or processed food. Limit the amount of salt in your diet to less than 1,500 mg each day. Do not drink alcohol if: Your doctor tells you not to drink. You are pregnant, may be pregnant, or are planning to become pregnant. If you drink alcohol: Limit how much you have to: 0-1 drink a day for women. 0-2 drinks a day for men. Know how much alcohol is in your drink. In the [place] one drink equals one 12 oz bottle of beer (355 mL), one 5 oz glass of wine (148 mL), or one 1 oz glass of hard liquor (44 mL). Lifestyle  Work with your doctor to stay at a healthy weight or to lose weight. Ask your doctor what the best weight is for you. Get at least 30 minutes of exercise that causes your heart to beat faster (aerobic exercise) most days of the week. This may include walking, swimming, or biking. Get at least 30 minutes of exercise that strengthens your muscles (resistance exercise) at least 3 days a week. This may include lifting weights or doing Pilates. Do not smoke or use any products that contain nicotine or tobacco. If you need help quitting, ask your doctor. Check your blood pressure at home as told by your doctor. Keep  all follow-up visits. Medicines Take over-the-counter and prescription medicines only as told by your doctor. Follow directions carefully. Do not skip doses of blood pressure medicine. The medicine does not work as well if you skip doses. Skipping doses also puts you at risk for problems. Ask your doctor about side effects  or reactions to medicines that you should watch for. Contact a doctor if: You think you are having a reaction to the medicine you are taking. You have headaches that keep coming back. You feel dizzy. You have swelling in your ankles. You have trouble with your vision. Get help right away if: You get a very bad headache. You start to feel mixed up (confused). You feel weak or numb. You feel faint. You have very bad pain in your: Chest. Belly (abdomen). You vomit more than once. You have trouble breathing. These symptoms may be an emergency. Get help right away. Call 911. Do not wait to see if the symptoms will go away. Do not drive yourself to the hospital. Summary Hypertension is another name for high blood pressure. High blood pressure forces your heart to work harder to pump blood. For most people, a normal blood pressure is less than 120/80. Making healthy choices can help lower blood pressure. If your blood pressure does not get lower with healthy choices, you may need to take medicine. This information is not intended to replace advice given to you by your health care provider. Make sure you discuss any questions you have with your health care provider. Document Revised: 12/14/2020 Document Reviewed: 12/14/2020 Elsevier Patient Education  [place].

## 2021-09-01 LAB — ALKALINE PHOSPHATASE, ISOENZYMES
BONE FRACTION: 42 % (ref 12–68)
INTESTINAL FRAC.: 30 % — ABNORMAL HIGH (ref 0–18)
LIVER FRACTION: 28 % (ref 13–88)

## 2021-09-01 LAB — CMP14+EGFR
ALT: 15 IU/L (ref 0–44)
AST: 17 IU/L (ref 0–40)
Albumin/Globulin Ratio: 1.3 (ref 1.2–2.2)
Albumin: 4.3 g/dL (ref 3.7–4.7)
Alkaline Phosphatase: 140 IU/L — ABNORMAL HIGH (ref 44–121)
BUN/Creatinine Ratio: 16 (ref 10–24)
BUN: 18 mg/dL (ref 8–27)
Bilirubin Total: 0.6 mg/dL (ref 0.0–1.2)
CO2: 29 mmol/L (ref 20–29)
Calcium: 10.3 mg/dL — ABNORMAL HIGH (ref 8.6–10.2)
Chloride: 100 mmol/L (ref 96–106)
Creatinine, Ser: 1.15 mg/dL (ref 0.76–1.27)
Globulin, Total: 3.3 g/dL (ref 1.5–4.5)
Glucose: 99 mg/dL (ref 70–99)
Potassium: 3.7 mmol/L (ref 3.5–5.2)
Sodium: 143 mmol/L (ref 134–144)
Total Protein: 7.6 g/dL (ref 6.0–8.5)
eGFR: 66 mL/min/{1.73_m2} (ref >59)

## 2021-09-01 LAB — HEMOGLOBIN A1C
Est. average glucose Bld gHb Est-mCnc: 123 mg/dL
Hgb A1c MFr Bld: 5.9 % — ABNORMAL HIGH (ref 4.8–5.6)

## 2021-09-01 LAB — VITAMIN D 25 HYDROXY (VIT D DEFICIENCY, FRACTURES): Vit D, 25-Hydroxy: 43 ng/mL (ref 30.0–100.0)

## 2021-09-05 ENCOUNTER — Other Ambulatory Visit: Payer: Medicare Other

## 2021-09-06 ENCOUNTER — Other Ambulatory Visit (INDEPENDENT_AMBULATORY_CARE_PROVIDER_SITE_OTHER): Payer: Medicare Other

## 2021-09-06 ENCOUNTER — Telehealth: Payer: Self-pay | Admitting: Internal Medicine

## 2021-09-06 DIAGNOSIS — E0590 Thyrotoxicosis, unspecified without thyrotoxic crisis or storm: Secondary | ICD-10-CM

## 2021-09-06 LAB — TSH: TSH: <0.01 u[IU]/mL — ABNORMAL LOW (ref 0.35–5.50)

## 2021-09-06 LAB — T3, FREE: T3, Free: 5.8 pg/mL — ABNORMAL HIGH (ref 2.3–4.2)

## 2021-09-06 LAB — T4, FREE: Free T4: 2 ng/dL — ABNORMAL HIGH (ref 0.60–1.60)

## 2021-09-06 MED ORDER — METHIMAZOLE 5 MG PO TABS: 5.0000 mg | ORAL_TABLET | Freq: Three times a day (TID) | ORAL | 3 refills | Status: DC

## 2021-09-06 NOTE — Telephone Encounter (Signed)
Please let the patient know that his thyroid is overactive again   Please asked the patient to start methimazole but only take 5 mg daily   A new prescription has been sent to the pharmacy([facility])   He may use half a tablet of methimazole 10 mg if he still has been  Next checkup with Dr. Cruzita Lederer is in September    Abby Nena Jordan, MD  [facility] Endocrinology  [facility] [address] [place], [place] [address] Phone: [phone] FAX: [phone]

## 2021-09-18 ENCOUNTER — Ambulatory Visit: Payer: Medicare Other

## 2021-09-18 VITALS — BP 132/70 | HR 82 | Temp 98.7°F

## 2021-09-18 DIAGNOSIS — I129 Hypertensive chronic kidney disease with stage 1 through stage 4 chronic kidney disease, or unspecified chronic kidney disease: Secondary | ICD-10-CM

## 2021-09-18 NOTE — Progress Notes (Signed)
Patient presents today for BPC. He is currently taking Amlodipine '10mg'$  metoprolol 25 and valsartan htcz 160-25.   BP Readings from Last 3 Encounters:  09/18/21 132/70  08/27/21 (!) 160/88  07/23/21 (!) 148/100   Pt advised to continue with current medication. Increase water intake and limit sodium intake.

## 2021-11-22 ENCOUNTER — Encounter: Payer: Self-pay | Admitting: Internal Medicine

## 2021-11-22 ENCOUNTER — Ambulatory Visit: Payer: Medicare Other | Admitting: Internal Medicine

## 2021-11-22 VITALS — BP 130/80 | HR 92 | Ht 72.0 in | Wt 174.0 lb

## 2021-11-22 DIAGNOSIS — E0500 Thyrotoxicosis with diffuse goiter without thyrotoxic crisis or storm: Secondary | ICD-10-CM

## 2021-11-22 LAB — T3, FREE: T3, Free: 3.5 pg/mL (ref 2.3–4.2)

## 2021-11-22 LAB — TSH: TSH: 3.49 u[IU]/mL (ref 0.35–5.50)

## 2021-11-22 LAB — T4, FREE: Free T4: 0.65 ng/dL (ref 0.60–1.60)

## 2021-11-22 MED ORDER — METHIMAZOLE 5 MG PO TABS
5.0000 mg | ORAL_TABLET | Freq: Every day | ORAL | 3 refills | Status: DC
Start: 2021-11-22 — End: 2022-05-28

## 2021-11-22 MED ORDER — METHIMAZOLE 5 MG PO TABS: 5.0000 mg | ORAL_TABLET | Freq: Every day | ORAL | 3 refills | Status: DC

## 2021-11-22 NOTE — Patient Instructions (Signed)
For now, continue: - Methimazole 5 mg daily  - Toprol XL 25 mg daily  Please stop at the lab.  Please return in 6 months, but for labs sooner.

## 2021-11-22 NOTE — Progress Notes (Signed)
Patient ID: Lawrence Harrell, male   DOB: [birth year], 76 y.o.   MRN: [id number]  HPI  Lawrence Harrell is a 76 y.o.-year-old male, returning for follow-up for thyrotoxicosis, diagnosed in 2022, with presumed diagnosis of Graves' disease.  He was previously seen by Dr. Loanne Drilling.  Last visit with me 4 months ago.  Interim hx.: At today's visit, he feels well, without complaints.   Reviewed history: He was diagnosed with thyrotoxicosis in 12/2020.   Before dx., he lost weight - 30 lbs (from 170-173 lbs) in 2 months.  At last visit he was on: - Methimazole 10 mg daily (dose reduced from 10 mg 2x in 05/2021) - Toprol XL 25 mg daily At that time, we stopped methimazole due to persistently elevated TSH.  In 08/2021, we had to restart methimazole at 5 mg daily.  I reviewed pt's thyroid tests: Lab Results  Component Value Date   TSH <0.01 Repeated and verified X2. (L) 09/06/2021   TSH 10.13 (H) 07/23/2021   TSH 8.09 (H) 05/15/2021   TSH 0.01 (L) 04/02/2021   TSH <0.01 02/13/2021   TSH <0.005 (L) 12/19/2020   FREET4 2.00 (H) 09/06/2021   FREET4 0.62 07/23/2021   FREET4 0.40 (L) 05/15/2021   FREET4 0.64 04/02/2021   FREET4 1.94 (H) 02/13/2021   FREET4 3.28 (H) 12/19/2020   T3FREE 5.8 (H) 09/06/2021   T3FREE 3.3 07/23/2021   T3FREE 10.2 (H) 12/19/2020   Antithyroid antibodies: Lab Results  Component Value Date   TSI 500 (H) 07/23/2021   Pt denies: - feeling nodules in neck - hoarseness - dysphagia - choking  He denies: - fatigue - excessive sweating/heat intolerance - tremors - anxiety - palpitations - hyperdefecation - weight loss   Pt does have a FH of thyroid ds.in sister, niece. No FH of thyroid cancer. No h/o radiation tx to head or neck. No recent contrast studies. No steroid use. No herbal supplements. No Biotin use.  Pt. also has a history of prediabetes, HTN, HL, history of renal cell carcinoma.  ROS: Constitutional: + see HPI  Past Medical History:  Diagnosis  Date   BPH with obstruction/lower urinary tract symptoms    High cholesterol    HTN (hypertension)    Prediabetes    Renal cell cancer, right [facility])    Past Surgical History:  Procedure Laterality Date   gland extraction  09/2005   Salivary    nephrectomy  10/06/2013   partial    Social History   Socioeconomic History   Marital status: Married    Spouse name: Not on file   Number of children: Not on file   Years of education: Not on file   Highest education level: Not on file  Occupational History   Not on file  Tobacco Use   Smoking status: Never   Smokeless tobacco: Never  Vaping Use   Vaping Use: Never used  Substance and Sexual Activity   Alcohol use: Never   Drug use: Never   Sexual activity: Yes  Other Topics Concern   Not on file  Social History Narrative   Not on file   Social Determinants of Health   Financial Resource Strain: Low Risk  (05/09/2021)   Overall Financial Resource Strain (CARDIA)    Difficulty of Paying Living Expenses: Not hard at all  Food Insecurity: No Food Insecurity (05/09/2021)   Hunger Vital Sign    Worried About Running Out of Food in the Last Year: Never true    Ran Out  of Food in the Last Year: Never true  Transportation Needs: No Transportation Needs (05/09/2021)   PRAPARE - Hydrologist (Medical): No    Lack of Transportation (Non-Medical): No  Physical Activity: Insufficiently Active (05/09/2021)   Exercise Vital Sign    Days of Exercise per Week: 7 days    Minutes of Exercise per Session: 20 min  Stress: No Stress Concern Present (05/09/2021)   [place]    Feeling of Stress : Not at all  Social Connections: Not on file  Intimate Partner Violence: Not on file   Current Outpatient Medications on File Prior to Visit  Medication Sig Dispense Refill   amLODipine (NORVASC) 10 MG tablet TAKE 1 TABLET BY MOUTH  DAILY 90 tablet 3    Cholecalciferol (VITAMIN D3) 2000 units TABS Take by mouth.     methimazole (TAPAZOLE) 5 MG tablet Take 1 tablet (5 mg total) by mouth 3 (three) times daily. 30 tablet 3   metoprolol succinate (TOPROL-XL) 25 MG 24 hr tablet Take 1 tablet (25 mg total) by mouth daily. 90 tablet 3   pravastatin (PRAVACHOL) 40 MG tablet TAKE 1 TABLET BY MOUTH  DAILY 90 tablet 3   valsartan-hydrochlorothiazide (DIOVAN-HCT) 160-25 MG tablet TAKE 1 TABLET BY MOUTH  DAILY 90 tablet 3   No current facility-administered medications on file prior to visit.   No Known Allergies Family History  Problem Relation Age of Onset   Hypertension Mother    Hypertension Father    Thyroid disease Sister    PE: BP 130/80 (BP Location: Right Arm, Patient Position: Sitting, Cuff Size: Normal)   Pulse 92   Ht 6' (1.829 m)   Wt 174 lb (78.9 kg)   SpO2 98%   BMI 23.60 kg/m  Wt Readings from Last 3 Encounters:  11/22/21 174 lb (78.9 kg)  08/27/21 171 lb 12.8 oz (77.9 kg)  07/23/21 174 lb 6.4 oz (79.1 kg)   Constitutional: Slightly overweight, in NAD Eyes: EOMI, no exophthalmos, no lid lag, no stare ENT: no thyromegaly, she is no cervical lymphadenopathy Cardiovascular: RRR (no tachycardia at the time of the exam), No MRG Respiratory: CTA B Musculoskeletal: no deformities Skin: moist, warm, no rashes Neurological: no tremor with outstretched hands  ASSESSMENT: 1. Graves ds.  PLAN:  1. Patient with history of low TSH with high free thyroid hormones, with thyrotoxic symptoms: Weight loss, heat intolerance, hyperdefecation, palpitations, anxiety, resolved after starting methimazole. -He was previously followed by Dr. Loanne Drilling.  I first saw the patient 4 months ago.  At that time, a TSH returned elevated, at 10.  TSI antibodies were elevated, confirming Graves' disease.  I advised him to stop methimazole 10 mg daily but return for labs to see if we need to restart it at a lower dose.  Indeed, he had labs again in 08/2021 and  at that time TSH was suppressed so methimazole was restarted at  5 mg daily. -At today's visit, he feels well, without thyrotoxic signs or symptoms.  No significant weight loss, tremors, palpitations or heat intolerance. -Also, no signs of Graves' ophthalmopathy: No double vision, blurry vision, eye pain, chemosis -He tolerates methimazole well -He also continues on a beta-blocker, Toprol-XL 25 mg daily. -We will recheck his TFTs today and change the methimazole dose accordingly.  We may need to do a slower methimazole taper if TSH is high. - I will see him back in 6 months, but possibly  sooner for labs  Orders Placed This Encounter  Procedures   TSH   T4, free   T3, free   Component     Latest Ref Rng 11/22/2021  TSH     0.35 - 5.50 uIU/mL 3.49   T4,Free(Direct)     0.60 - 1.60 ng/dL 0.65   Triiodothyronine,Free,Serum     2.3 - 4.2 pg/mL 3.5   Methimazole was appears to be adequate. I refilled his methimazole today.  Philemon Kingdom, MD PhD [facility] Endocrinology

## 2021-11-23 ENCOUNTER — Other Ambulatory Visit: Payer: Self-pay | Admitting: Internal Medicine

## 2021-11-27 ENCOUNTER — Other Ambulatory Visit: Payer: Self-pay

## 2021-11-27 DIAGNOSIS — E0590 Thyrotoxicosis, unspecified without thyrotoxic crisis or storm: Secondary | ICD-10-CM

## 2021-11-27 DIAGNOSIS — I129 Hypertensive chronic kidney disease with stage 1 through stage 4 chronic kidney disease, or unspecified chronic kidney disease: Secondary | ICD-10-CM

## 2021-11-27 MED ORDER — METOPROLOL SUCCINATE ER 25 MG PO TB24: 25.0000 mg | ORAL_TABLET | Freq: Every day | ORAL | 3 refills | Status: DC

## 2022-01-22 ENCOUNTER — Encounter: Payer: Self-pay | Admitting: Internal Medicine

## 2022-01-22 ENCOUNTER — Ambulatory Visit (INDEPENDENT_AMBULATORY_CARE_PROVIDER_SITE_OTHER): Payer: Medicare Other | Admitting: Internal Medicine

## 2022-01-22 VITALS — BP 140/72 | HR 78 | Temp 98.1°F | Ht 72.0 in | Wt 175.4 lb

## 2022-01-22 DIAGNOSIS — I129 Hypertensive chronic kidney disease with stage 1 through stage 4 chronic kidney disease, or unspecified chronic kidney disease: Secondary | ICD-10-CM | POA: Diagnosis not present

## 2022-01-22 DIAGNOSIS — N182 Chronic kidney disease, stage 2 (mild): Secondary | ICD-10-CM | POA: Diagnosis not present

## 2022-01-22 DIAGNOSIS — R7309 Other abnormal glucose: Secondary | ICD-10-CM | POA: Diagnosis not present

## 2022-01-22 DIAGNOSIS — R748 Abnormal levels of other serum enzymes: Secondary | ICD-10-CM

## 2022-01-22 DIAGNOSIS — Z2821 Immunization not carried out because of patient refusal: Secondary | ICD-10-CM | POA: Diagnosis not present

## 2022-01-22 NOTE — Patient Instructions (Signed)
Hypertension, Adult ?Hypertension is another name for high blood pressure. High blood pressure forces your heart to work harder to pump blood. This can cause problems over time. ?There are two numbers in a blood pressure reading. There is a top number (systolic) over a bottom number (diastolic). It is best to have a blood pressure that is below 120/80. ?What are the causes? ?The cause of this condition is not known. Some other conditions can lead to high blood pressure. ?What increases the risk? ?Some lifestyle factors can make you more likely to develop high blood pressure: ?Smoking. ?Not getting enough exercise or physical activity. ?Being overweight. ?Having too much fat, sugar, calories, or salt (sodium) in your diet. ?Drinking too much alcohol. ?Other risk factors include: ?Having any of these conditions: ?Heart disease. ?Diabetes. ?High cholesterol. ?Kidney disease. ?Obstructive sleep apnea. ?Having a family history of high blood pressure and high cholesterol. ?Age. The risk increases with age. ?Stress. ?What are the signs or symptoms? ?High blood pressure may not cause symptoms. Very high blood pressure (hypertensive crisis) may cause: ?Headache. ?Fast or uneven heartbeats (palpitations). ?Shortness of breath. ?Nosebleed. ?Vomiting or feeling like you may vomit (nauseous). ?Changes in how you see. ?Very bad chest pain. ?Feeling dizzy. ?Seizures. ?How is this treated? ?This condition is treated by making healthy lifestyle changes, such as: ?Eating healthy foods. ?Exercising more. ?Drinking less alcohol. ?Your doctor may prescribe medicine if lifestyle changes do not help enough and if: ?Your top number is above 130. ?Your bottom number is above 80. ?Your personal target blood pressure may vary. ?Follow these instructions at home: ?Eating and drinking ? ?If told, follow the DASH eating plan. To follow this plan: ?Fill one half of your plate at each meal with fruits and vegetables. ?Fill one fourth of your plate  at each meal with whole grains. Whole grains include whole-wheat pasta, brown rice, and whole-grain bread. ?Eat or drink low-fat dairy products, such as skim milk or low-fat yogurt. ?Fill one fourth of your plate at each meal with low-fat (lean) proteins. Low-fat proteins include fish, chicken without skin, eggs, beans, and tofu. ?Avoid fatty meat, cured and processed meat, or chicken with skin. ?Avoid pre-made or processed food. ?Limit the amount of salt in your diet to less than 1,500 mg each day. ?Do not drink alcohol if: ?Your doctor tells you not to drink. ?You are pregnant, may be pregnant, or are planning to become pregnant. ?If you drink alcohol: ?Limit how much you have to: ?0-1 drink a day for women. ?0-2 drinks a day for men. ?Know how much alcohol is in your drink. In the [place] one drink equals one 12 oz bottle of beer (355 mL), one 5 oz glass of wine (148 mL), or one 1? oz glass of hard liquor (44 mL). ?Lifestyle ? ?Work with your doctor to stay at a healthy weight or to lose weight. Ask your doctor what the best weight is for you. ?Get at least 30 minutes of exercise that causes your heart to beat faster (aerobic exercise) most days of the week. This may include walking, swimming, or biking. ?Get at least 30 minutes of exercise that strengthens your muscles (resistance exercise) at least 3 days a week. This may include lifting weights or doing Pilates. ?Do not smoke or use any products that contain nicotine or tobacco. If you need help quitting, ask your doctor. ?Check your blood pressure at home as told by your doctor. ?Keep all follow-up visits. ?Medicines ?Take over-the-counter and prescription medicines   only as told by your doctor. Follow directions carefully. ?Do not skip doses of blood pressure medicine. The medicine does not work as well if you skip doses. Skipping doses also puts you at risk for problems. ?Ask your doctor about side effects or reactions to medicines that you should watch  for. ?Contact a doctor if: ?You think you are having a reaction to the medicine you are taking. ?You have headaches that keep coming back. ?You feel dizzy. ?You have swelling in your ankles. ?You have trouble with your vision. ?Get help right away if: ?You get a very bad headache. ?You start to feel mixed up (confused). ?You feel weak or numb. ?You feel faint. ?You have very bad pain in your: ?Chest. ?Belly (abdomen). ?You vomit more than once. ?You have trouble breathing. ?These symptoms may be an emergency. Get help right away. Call 911. ?Do not wait to see if the symptoms will go away. ?Do not drive yourself to the hospital. ?Summary ?Hypertension is another name for high blood pressure. ?High blood pressure forces your heart to work harder to pump blood. ?For most people, a normal blood pressure is less than 120/80. ?Making healthy choices can help lower blood pressure. If your blood pressure does not get lower with healthy choices, you may need to take medicine. ?This information is not intended to replace advice given to you by your health care provider. Make sure you discuss any questions you have with your health care provider. ?Document Revised: 12/14/2020 Document Reviewed: 12/14/2020 ?Elsevier Patient Education ? 2023 [employer] ? ?

## 2022-01-22 NOTE — Progress Notes (Signed)
Barnet Glasgow Martin,acting as a Education administrator for Maximino Greenland, MD.,have documented all relevant documentation on the behalf of Maximino Greenland, MD,as directed by  Maximino Greenland, MD while in the presence of Maximino Greenland, MD.    Subjective:     Patient ID: Lawrence Harrell , male    DOB: 02-11-1946 , 76 y.o.   MRN: [id number]   Chief Complaint  Patient presents with   Hypertension    HPI  Patient presents today for a BP check, patient reports compliance with medications. Patient has no other concerns today.  He denies having any headaches, chest pain and shortness of breath. He admits he is not exercising as much as he has in the past.   BP Readings from Last 3 Encounters: 01/22/22 : (!) 140/72 11/22/21 : 130/80 09/18/21 : 132/70    Hypertension This is a chronic problem. The current episode started more than 1 year ago. The problem has been gradually improving since onset. The problem is controlled. Pertinent negatives include no blurred vision, chest pain, palpitations or shortness of breath. Risk factors for coronary artery disease include dyslipidemia and male gender. The current treatment provides moderate improvement. Hypertensive end-organ damage includes kidney disease.     Past Medical History:  Diagnosis Date   BPH with obstruction/lower urinary tract symptoms    High cholesterol    HTN (hypertension)    Prediabetes    Renal cell cancer, right ([place])      Family History  Problem Relation Age of Onset   Hypertension Mother    Hypertension Father    Thyroid disease Sister      Current Outpatient Medications:    amLODipine (NORVASC) 10 MG tablet, TAKE 1 TABLET BY MOUTH  DAILY, Disp: 90 tablet, Rfl: 3   Cholecalciferol (VITAMIN D3) 2000 units TABS, Take by mouth., Disp: , Rfl:    methimazole (TAPAZOLE) 5 MG tablet, Take 1 tablet (5 mg total) by mouth daily., Disp: 90 tablet, Rfl: 3   metoprolol succinate (TOPROL-XL) 25 MG 24 hr tablet, Take 1 tablet (25 mg total) by  mouth daily., Disp: 90 tablet, Rfl: 3   pravastatin (PRAVACHOL) 40 MG tablet, TAKE 1 TABLET BY MOUTH  DAILY, Disp: 90 tablet, Rfl: 3   valsartan-hydrochlorothiazide (DIOVAN-HCT) 160-25 MG tablet, TAKE 1 TABLET BY MOUTH  DAILY, Disp: 90 tablet, Rfl: 3   No Known Allergies   Review of Systems  Constitutional: Negative.   HENT: Negative.    Eyes: Negative.  Negative for blurred vision.  Respiratory: Negative.  Negative for shortness of breath.   Cardiovascular: Negative.  Negative for chest pain and palpitations.  Gastrointestinal: Negative.      Today's Vitals   01/22/22 0827 01/22/22 0846  BP: (!) 140/72 (!) 140/72  Pulse: 78   Temp: 98.1 F (36.7 C)   TempSrc: Oral   Weight: 175 lb 6.4 oz (79.6 kg)   Height: 6' (1.829 m)   PainSc: 0-No pain    Body mass index is 23.79 kg/m.  Wt Readings from Last 3 Encounters:  01/22/22 175 lb 6.4 oz (79.6 kg)  11/22/21 174 lb (78.9 kg)  08/27/21 171 lb 12.8 oz (77.9 kg)   BP Readings from Last 3 Encounters:  01/22/22 (!) 140/72  11/22/21 130/80  09/18/21 132/70    Objective:  Physical Exam Vitals and nursing note reviewed.  Constitutional:      Appearance: Normal appearance.  HENT:     Head: Normocephalic and atraumatic.     Nose:  Comments: Masked     Mouth/Throat:     Comments: Masked  Eyes:     Extraocular Movements: Extraocular movements intact.  Cardiovascular:     Rate and Rhythm: Normal rate and regular rhythm.     Heart sounds: Normal heart sounds.  Pulmonary:     Effort: Pulmonary effort is normal.     Breath sounds: Normal breath sounds.  Musculoskeletal:     Cervical back: Normal range of motion.  Skin:    General: Skin is warm.  Neurological:     General: No focal deficit present.     Mental Status: He is alert.  Psychiatric:        Mood and Affect: Mood normal.         Assessment And Plan:     1. Hypertensive nephropathy Comments: Chronic, uncontrolled. For now, he will c/w valsartan/hctz  160/18m, amlodipine 10 and metoprolol XL 227m He will rto in Feb 2024 for his next CPE.  He is encouraged to incorporate more exercise into his daily routine, he agrees. He plans to resume his walking regimen and agrees to start doing some pushups, squats, etc.   2. Chronic renal disease, stage II Comments: Chronic, he is encouraged to avoid NSAIDS, stay well hydrated and keep BP well controlled to decrease risk of CKD progression.  3. Other abnormal glucose Comments: His a1c has been elevated in the past. I will recheck this today. He is encouraged to limit his intake of sugary foods and beverages. - Hemoglobin A1c  4. Elevated alkaline phosphatase level Comments: Previous isoenzymes study implies GI source. I will recheck today. He is willing to see GI if needed. - Alkaline Phosphatase, Isoenzymes - CMP14+EGFR - Phosphorus  5. Immunization declined   Patient was given opportunity to ask questions. Patient verbalized understanding of the plan and was able to repeat key elements of the plan. All questions were answered to their satisfaction.   I, RoMaximino GreenlandMD, have reviewed all documentation for this visit. The documentation on 01/22/22 for the exam, diagnosis, procedures, and orders are all accurate and complete.   IF YOU HAVE BEEN REFERRED TO A SPECIALIST, IT MAY TAKE 1-2 WEEKS TO SCHEDULE/PROCESS THE REFERRAL. IF YOU HAVE NOT HEARD FROM US/SPECIALIST IN TWO WEEKS, PLEASE GIVE [place] CALL AT [phone].   THE PATIENT IS ENCOURAGED TO PRACTICE SOCIAL DISTANCING DUE TO THE COVID-19 PANDEMIC.

## 2022-01-26 LAB — CMP14+EGFR
ALT: 15 IU/L (ref 0–44)
AST: 18 IU/L (ref 0–40)
Albumin/Globulin Ratio: 1.3 (ref 1.2–2.2)
Albumin: 4.3 g/dL (ref 3.8–4.8)
Alkaline Phosphatase: 123 IU/L — ABNORMAL HIGH (ref 44–121)
BUN/Creatinine Ratio: 16 (ref 10–24)
BUN: 19 mg/dL (ref 8–27)
Bilirubin Total: 0.3 mg/dL (ref 0.0–1.2)
CO2: 29 mmol/L (ref 20–29)
Calcium: 9.4 mg/dL (ref 8.6–10.2)
Chloride: 99 mmol/L (ref 96–106)
Creatinine, Ser: 1.17 mg/dL (ref 0.76–1.27)
Globulin, Total: 3.2 g/dL (ref 1.5–4.5)
Glucose: 93 mg/dL (ref 70–99)
Potassium: 3.6 mmol/L (ref 3.5–5.2)
Sodium: 143 mmol/L (ref 134–144)
Total Protein: 7.5 g/dL (ref 6.0–8.5)
eGFR: 64 mL/min/{1.73_m2} (ref >59)

## 2022-01-26 LAB — ALKALINE PHOSPHATASE, ISOENZYMES
BONE FRACTION: 35 % (ref 12–68)
INTESTINAL FRAC.: 35 % — ABNORMAL HIGH (ref 0–18)
LIVER FRACTION: 30 % (ref 13–88)

## 2022-01-26 LAB — HEMOGLOBIN A1C
Est. average glucose Bld gHb Est-mCnc: 123 mg/dL
Hgb A1c MFr Bld: 5.9 % — ABNORMAL HIGH (ref 4.8–5.6)

## 2022-01-26 LAB — PHOSPHORUS: Phosphorus: 3.2 mg/dL (ref 2.8–4.1)

## 2022-03-07 ENCOUNTER — Other Ambulatory Visit: Payer: Self-pay | Admitting: Internal Medicine

## 2022-04-25 ENCOUNTER — Ambulatory Visit (INDEPENDENT_AMBULATORY_CARE_PROVIDER_SITE_OTHER): Payer: Medicare Other | Admitting: Internal Medicine

## 2022-04-25 ENCOUNTER — Encounter: Payer: Self-pay | Admitting: Internal Medicine

## 2022-04-25 VITALS — BP 132/90 | HR 81 | Temp 98.1°F | Ht 72.0 in | Wt 177.4 lb

## 2022-04-25 DIAGNOSIS — Z2821 Immunization not carried out because of patient refusal: Secondary | ICD-10-CM

## 2022-04-25 DIAGNOSIS — R7309 Other abnormal glucose: Secondary | ICD-10-CM | POA: Diagnosis not present

## 2022-04-25 DIAGNOSIS — R1900 Intra-abdominal and pelvic swelling, mass and lump, unspecified site: Secondary | ICD-10-CM | POA: Diagnosis not present

## 2022-04-25 DIAGNOSIS — R748 Abnormal levels of other serum enzymes: Secondary | ICD-10-CM

## 2022-04-25 DIAGNOSIS — I129 Hypertensive chronic kidney disease with stage 1 through stage 4 chronic kidney disease, or unspecified chronic kidney disease: Secondary | ICD-10-CM | POA: Diagnosis not present

## 2022-04-25 DIAGNOSIS — Z0001 Encounter for general adult medical examination with abnormal findings: Secondary | ICD-10-CM

## 2022-04-25 DIAGNOSIS — Z0000 Encounter for general adult medical examination without abnormal findings: Secondary | ICD-10-CM

## 2022-04-25 DIAGNOSIS — N182 Chronic kidney disease, stage 2 (mild): Secondary | ICD-10-CM | POA: Diagnosis not present

## 2022-04-25 LAB — POCT URINALYSIS DIPSTICK
Bilirubin, UA: NEGATIVE
Blood, UA: NEGATIVE
Glucose, UA: NEGATIVE
Ketones, UA: NEGATIVE
Leukocytes, UA: NEGATIVE
Nitrite, UA: NEGATIVE
Protein, UA: NEGATIVE
Spec Grav, UA: 1.02 (ref 1.010–1.025)
Urobilinogen, UA: 0.2 E.U./dL
pH, UA: 6.5 (ref 5.0–8.0)

## 2022-04-25 NOTE — Progress Notes (Signed)
I,Victoria T Hamilton,acting as a scribe for Maximino Greenland, MD.,have documented all relevant documentation on the behalf of Maximino Greenland, MD,as directed by  Maximino Greenland, MD while in the presence of Maximino Greenland, MD.   Subjective:     Patient ID: Lawrence Harrell , male    DOB: 05/28/1945 , 77 y.o.   MRN: [id number]   Chief Complaint  Patient presents with   Annual Exam   Hypertension    HPI  He is here today for a full physical examination. He has no specific concerns at this time. He reports compliance with meds. He denies headaches, chest pain and shortness of breath. He reports he took his medication this morning.   Hypertension This is a chronic problem. The current episode started more than 1 year ago. The problem has been gradually improving since onset. The problem is controlled. Pertinent negatives include no blurred vision, chest pain, palpitations or shortness of breath. Risk factors for coronary artery disease include dyslipidemia. Past treatments include angiotensin blockers. The current treatment provides moderate improvement. Hypertensive end-organ damage includes kidney disease.  Hyperlipidemia This is a chronic problem. The current episode started more than 1 year ago. He has no history of diabetes. Pertinent negatives include no chest pain or shortness of breath. Current antihyperlipidemic treatment includes statins. Risk factors for coronary artery disease include hypertension, male sex and dyslipidemia.     Past Medical History:  Diagnosis Date   BPH with obstruction/lower urinary tract symptoms    High cholesterol    HTN (hypertension)    Prediabetes    Renal cell cancer, right ([place])      Family History  Problem Relation Age of Onset   Hypertension Mother    Hypertension Father    Thyroid disease Sister      Current Outpatient Medications:    amLODipine (NORVASC) 10 MG tablet, TAKE 1 TABLET BY MOUTH  DAILY, Disp: 90 tablet, Rfl: 3    Cholecalciferol (VITAMIN D3) 2000 units TABS, Take by mouth., Disp: , Rfl:    methimazole (TAPAZOLE) 5 MG tablet, Take 1 tablet (5 mg total) by mouth daily., Disp: 90 tablet, Rfl: 3   metoprolol succinate (TOPROL-XL) 25 MG 24 hr tablet, Take 1 tablet (25 mg total) by mouth daily., Disp: 90 tablet, Rfl: 3   pravastatin (PRAVACHOL) 40 MG tablet, TAKE 1 TABLET BY MOUTH DAILY, Disp: 90 tablet, Rfl: 3   valsartan-hydrochlorothiazide (DIOVAN-HCT) 160-25 MG tablet, TAKE 1 TABLET BY MOUTH  DAILY, Disp: 90 tablet, Rfl: 3   No Known Allergies   Men's preventive visit. Patient Health Questionnaire (PHQ-2) is  [place] Office Visit from 04/25/2022 in [facility]  PHQ-2 Total Score 0     . Patient is on a healthy diet. Marital status: Married. Relevant history for alcohol use is:  Social History   Substance and Sexual Activity  Alcohol Use Never  . Relevant history for tobacco use is:  Social History   Tobacco Use  Smoking Status Never  Smokeless Tobacco Never  .   Review of Systems  Constitutional: Negative.   HENT: Negative.    Eyes: Negative.  Negative for blurred vision.  Respiratory: Negative.  Negative for shortness of breath.   Cardiovascular: Negative.  Negative for chest pain and palpitations.  Gastrointestinal: Negative.   Endocrine: Negative.   Genitourinary: Negative.   Musculoskeletal: Negative.   Skin: Negative.   Allergic/Immunologic: Negative.   Neurological: Negative.   Hematological: Negative.  Psychiatric/Behavioral: Negative.       Today's Vitals   04/25/22 0825 04/25/22 0901  BP: (!) 150/104 (!) 132/90  Pulse: 81   Temp: 98.1 F (36.7 C)   SpO2: 98%   Weight: 177 lb 6.4 oz (80.5 kg)   Height: 6' (1.829 m)    Body mass index is 24.06 kg/m.  Wt Readings from Last 3 Encounters:  04/25/22 177 lb 6.4 oz (80.5 kg)  01/22/22 175 lb 6.4 oz (79.6 kg)  11/22/21 174 lb (78.9 kg)    Objective:  Physical Exam Vitals and  nursing note reviewed.  Constitutional:      Appearance: Normal appearance.  HENT:     Head: Normocephalic and atraumatic.     Right Ear: Tympanic membrane, ear canal and external ear normal.     Left Ear: Tympanic membrane, ear canal and external ear normal.     Nose:     Comments: Masked     Mouth/Throat:     Comments: Masked  Eyes:     Extraocular Movements: Extraocular movements intact.     Conjunctiva/sclera: Conjunctivae normal.     Pupils: Pupils are equal, round, and reactive to light.  Cardiovascular:     Rate and Rhythm: Normal rate and regular rhythm.     Pulses: Normal pulses.     Heart sounds: Normal heart sounds.  Pulmonary:     Effort: Pulmonary effort is normal.     Breath sounds: Normal breath sounds.  Chest:  Breasts:    Right: Normal. No swelling, bleeding, inverted nipple, mass or nipple discharge.     Left: Normal. No swelling, bleeding, inverted nipple, mass or nipple discharge.  Abdominal:     General: Abdomen is flat. Bowel sounds are normal.     Palpations: Abdomen is soft.  Genitourinary:    Comments: Masked  Musculoskeletal:        General: Normal range of motion.     Cervical back: Normal range of motion and neck supple.  Skin:    General: Skin is warm.     Comments: 2.5x2cm soft tissue well circumscribed mass LUQ, nontender  Neurological:     General: No focal deficit present.     Mental Status: He is alert.  Psychiatric:        Mood and Affect: Mood normal.        Behavior: Behavior normal.      Assessment And Plan:    1. Routine general medical examination at a health care facility Comments: A full exam was performed. DRE deferred, per patient request. PATIENT IS ADVISED TO GET 30-45 MINUTES REGULAR EXERCISE NO LESS THAN FOUR TO FIVE DAYS PER WEEK - BOTH WEIGHTBEARING EXERCISES AND AEROBIC ARE RECOMMENDED.  PATIENT IS ADVISED TO FOLLOW A HEALTHY DIET WITH AT LEAST SIX FRUITS/VEGGIES PER DAY, DECREASE INTAKE OF RED MEAT, AND TO INCREASE  FISH INTAKE TO TWO DAYS PER WEEK.  MEATS/FISH SHOULD NOT BE FRIED, BAKED OR BROILED IS PREFERABLE.  IT IS ALSO IMPORTANT TO CUT BACK ON YOUR SUGAR INTAKE. PLEASE AVOID ANYTHING WITH ADDED SUGAR, CORN SYRUP OR OTHER SWEETENERS. IF YOU MUST USE A SWEETENER, YOU CAN TRY STEVIA. IT IS ALSO IMPORTANT TO AVOID ARTIFICIALLY SWEETENERS AND DIET BEVERAGES. LASTLY, I SUGGEST WEARING SPF 50 SUNSCREEN ON EXPOSED PARTS AND ESPECIALLY WHEN IN THE DIRECT SUNLIGHT FOR AN EXTENDED PERIOD OF TIME.  PLEASE AVOID FAST FOOD RESTAURANTS AND INCREASE YOUR WATER INTAKE.   2. Hypertensive nephropathy Comments: Chronic, uncontrolled. EKG performed, RAE, voltage for  LVH. He will c/w valsartan/hct 160/53m and amlodipine 115mdaily. Importance of compliance w/ meds/diet and exercise was stressed to the patient. Encouraged to follow low sodium diet. He will f/u in 4 months for re-evaluation.  - POCT Urinalysis Dipstick (81002) - Microalbumin / creatinine urine ratio - EKG 12-Lead - CMP14+EGFR - Lipid panel - CBC  3. Chronic renal disease, stage II Comments: Chronic, encouraged to avoid NSAIDS, stay hydrated and keep BP well controlled to decrease risk of CKD progression.  4. Mass of soft tissue of abdomen Comments: I will refer him for soft tissue ultrasound. Ths is suggestive of lipoma. - [place] SOFT TISSUE; Future  5. Elevated alkaline phosphatase level Comments: I will check labs as below. I will refer to GI if persistently elevated. - Alkaline Phosphatase, Isoenzymes - Mitochondrial/smooth muscle ab pnl  6. Other abnormal glucose Comments: Previous labs reviewed, I will recheck an a1c today.  He is encouraged to decrease intake of sugary beverages including sodas, juices and sweet tea. - POCT Urinalysis Dipstick (81002) - Microalbumin / creatinine urine ratio - EKG 12-Lead - Hemoglobin A1c  7. Tetanus, diphtheria, and acellular pertussis (Tdap) vaccination declined  Patient was given opportunity to ask  questions. Patient verbalized understanding of the plan and was able to repeat key elements of the plan. All questions were answered to their satisfaction.   I, RoMaximino GreenlandMD, have reviewed all documentation for this visit. The documentation on 04/28/22 for the exam, diagnosis, procedures, and orders are all accurate and complete.   THE PATIENT IS ENCOURAGED TO PRACTICE SOCIAL DISTANCING DUE TO THE COVID-19 PANDEMIC.

## 2022-04-25 NOTE — Patient Instructions (Signed)
Health Maintenance, Male Adopting a healthy lifestyle and getting preventive care are important in promoting health and wellness. Ask your health care provider about: The right schedule for you to have regular tests and exams. Things you can do on your own to prevent diseases and keep yourself healthy. What should I know about diet, weight, and exercise? Eat a healthy diet  Eat a diet that includes plenty of vegetables, fruits, low-fat dairy products, and lean protein. Do not eat a lot of foods that are high in solid fats, added sugars, or sodium. Maintain a healthy weight Body mass index (BMI) is a measurement that can be used to identify possible weight problems. It estimates body fat based on height and weight. Your health care provider can help determine your BMI and help you achieve or maintain a healthy weight. Get regular exercise Get regular exercise. This is one of the most important things you can do for your health. Most adults should: Exercise for at least 150 minutes each week. The exercise should increase your heart rate and make you sweat (moderate-intensity exercise). Do strengthening exercises at least twice a week. This is in addition to the moderate-intensity exercise. Spend less time sitting. Even light physical activity can be beneficial. Watch cholesterol and blood lipids Have your blood tested for lipids and cholesterol at 78 years of age, then have this test every 5 years. You may need to have your cholesterol levels checked more often if: Your lipid or cholesterol levels are high. You are older than 78 years of age. You are at high risk for heart disease. What should I know about cancer screening? Many types of cancers can be detected early and may often be prevented. Depending on your health history and family history, you may need to have cancer screening at various ages. This may include screening for: Colorectal cancer. Prostate cancer. Skin cancer. Lung  cancer. What should I know about heart disease, diabetes, and high blood pressure? Blood pressure and heart disease High blood pressure causes heart disease and increases the risk of stroke. This is more likely to develop in people who have high blood pressure readings or are overweight. Talk with your health care provider about your target blood pressure readings. Have your blood pressure checked: Every 3-5 years if you are 18-39 years of age. Every year if you are 40 years old or older. If you are between the ages of 65 and 75 and are a current or former smoker, ask your health care provider if you should have a one-time screening for abdominal aortic aneurysm (AAA). Diabetes Have regular diabetes screenings. This checks your fasting blood sugar level. Have the screening done: Once every three years after age 45 if you are at a normal weight and have a low risk for diabetes. More often and at a younger age if you are overweight or have a high risk for diabetes. What should I know about preventing infection? Hepatitis B If you have a higher risk for hepatitis B, you should be screened for this virus. Talk with your health care provider to find out if you are at risk for hepatitis B infection. Hepatitis C Blood testing is recommended for: Everyone born from 1945 through 1965. Anyone with known risk factors for hepatitis C. Sexually transmitted infections (STIs) You should be screened each year for STIs, including gonorrhea and chlamydia, if: You are sexually active and are younger than 78 years of age. You are older than 78 years of age and your   health care provider tells you that you are at risk for this type of infection. Your sexual activity has changed since you were last screened, and you are at increased risk for chlamydia or gonorrhea. Ask your health care provider if you are at risk. Ask your health care provider about whether you are at high risk for HIV. Your health care provider  may recommend a prescription medicine to help prevent HIV infection. If you choose to take medicine to prevent HIV, you should first get tested for HIV. You should then be tested every 3 months for as long as you are taking the medicine. Follow these instructions at home: Alcohol use Do not drink alcohol if your health care provider tells you not to drink. If you drink alcohol: Limit how much you have to 0-2 drinks a day. Know how much alcohol is in your drink. In the [place] one drink equals one 12 oz bottle of beer (355 mL), one 5 oz glass of wine (148 mL), or one 1 oz glass of hard liquor (44 mL). Lifestyle Do not use any products that contain nicotine or tobacco. These products include cigarettes, chewing tobacco, and vaping devices, such as e-cigarettes. If you need help quitting, ask your health care provider. Do not use street drugs. Do not share needles. Ask your health care provider for help if you need support or information about quitting drugs. General instructions Schedule regular health, dental, and eye exams. Stay current with your vaccines. Tell your health care provider if: You often feel depressed. You have ever been abused or do not feel safe at home. Summary Adopting a healthy lifestyle and getting preventive care are important in promoting health and wellness. Follow your health care provider's instructions about healthy diet, exercising, and getting tested or screened for diseases. Follow your health care provider's instructions on monitoring your cholesterol and blood pressure. This information is not intended to replace advice given to you by your health care provider. Make sure you discuss any questions you have with your health care provider. Document Revised: 07/17/2020 Document Reviewed: 07/17/2020 Elsevier Patient Education  2023 [employer]  

## 2022-04-26 LAB — MICROALBUMIN / CREATININE URINE RATIO
Creatinine, Urine: 112.8 mg/dL
Microalb/Creat Ratio: 8 mg/g creat (ref 0–29)
Microalbumin, Urine: 8.6 ug/mL

## 2022-05-01 LAB — CBC
Hematocrit: 45.5 % (ref 37.5–51.0)
Hemoglobin: 14.3 g/dL (ref 13.0–17.7)
MCH: 26.5 pg — ABNORMAL LOW (ref 26.6–33.0)
MCHC: 31.4 g/dL — ABNORMAL LOW (ref 31.5–35.7)
MCV: 84 fL (ref 79–97)
Platelets: 149 10*3/uL — ABNORMAL LOW (ref 150–450)
RBC: 5.39 x10E6/uL (ref 4.14–5.80)
RDW: 14 % (ref 11.6–15.4)
WBC: 4.9 10*3/uL (ref 3.4–10.8)

## 2022-05-01 LAB — LIPID PANEL
Chol/HDL Ratio: 2.7 ratio (ref 0.0–5.0)
Cholesterol, Total: 165 mg/dL (ref 100–199)
HDL: 61 mg/dL (ref >39)
LDL Chol Calc (NIH): 94 mg/dL (ref 0–99)
Triglycerides: 51 mg/dL (ref 0–149)
VLDL Cholesterol Cal: 10 mg/dL (ref 5–40)

## 2022-05-01 LAB — CMP14+EGFR
ALT: 14 IU/L (ref 0–44)
AST: 16 IU/L (ref 0–40)
Albumin/Globulin Ratio: 1.5 (ref 1.2–2.2)
Albumin: 4.6 g/dL (ref 3.8–4.8)
Alkaline Phosphatase: 115 IU/L (ref 44–121)
BUN/Creatinine Ratio: 14 (ref 10–24)
BUN: 16 mg/dL (ref 8–27)
Bilirubin Total: 0.4 mg/dL (ref 0.0–1.2)
CO2: 26 mmol/L (ref 20–29)
Calcium: 9.2 mg/dL (ref 8.6–10.2)
Chloride: 98 mmol/L (ref 96–106)
Creatinine, Ser: 1.12 mg/dL (ref 0.76–1.27)
Globulin, Total: 3.1 g/dL (ref 1.5–4.5)
Glucose: 89 mg/dL (ref 70–99)
Potassium: 3.3 mmol/L — ABNORMAL LOW (ref 3.5–5.2)
Sodium: 141 mmol/L (ref 134–144)
Total Protein: 7.7 g/dL (ref 6.0–8.5)
eGFR: 68 mL/min/{1.73_m2} (ref >59)

## 2022-05-01 LAB — HEMOGLOBIN A1C
Est. average glucose Bld gHb Est-mCnc: 126 mg/dL
Hgb A1c MFr Bld: 6 % — ABNORMAL HIGH (ref 4.8–5.6)

## 2022-05-01 LAB — MITOCHONDRIAL/SMOOTH MUSCLE AB PNL
Mitochondrial Ab: <20 Units (ref 0.0–20.0)
Smooth Muscle Ab: 16 Units (ref 0–19)

## 2022-05-01 LAB — ALKALINE PHOSPHATASE, ISOENZYMES
BONE FRACTION: 31 % (ref 12–68)
INTESTINAL FRAC.: 35 % — ABNORMAL HIGH (ref 0–18)
LIVER FRACTION: 34 % (ref 13–88)

## 2022-05-02 ENCOUNTER — Ambulatory Visit (HOSPITAL_BASED_OUTPATIENT_CLINIC_OR_DEPARTMENT_OTHER): Payer: Medicare Other

## 2022-05-07 DIAGNOSIS — H40053 Ocular hypertension, bilateral: Secondary | ICD-10-CM | POA: Diagnosis not present

## 2022-05-09 ENCOUNTER — Ambulatory Visit (HOSPITAL_BASED_OUTPATIENT_CLINIC_OR_DEPARTMENT_OTHER)
Admission: RE | Admit: 2022-05-09 | Discharge: 2022-05-09 | Disposition: A | Discharge status: Home / Self Care | Payer: Medicare Other | Source: Ambulatory Visit | Attending: Internal Medicine | Admitting: Internal Medicine

## 2022-05-09 DIAGNOSIS — R1900 Intra-abdominal and pelvic swelling, mass and lump, unspecified site: Secondary | ICD-10-CM | POA: Diagnosis not present

## 2022-05-09 DIAGNOSIS — R222 Localized swelling, mass and lump, trunk: Secondary | ICD-10-CM | POA: Diagnosis not present

## 2022-05-28 ENCOUNTER — Encounter: Payer: Self-pay | Admitting: Internal Medicine

## 2022-05-28 ENCOUNTER — Ambulatory Visit: Payer: Medicare Other | Admitting: Internal Medicine

## 2022-05-28 VITALS — BP 120/88 | HR 77 | Ht 72.0 in | Wt 178.2 lb

## 2022-05-28 DIAGNOSIS — E0500 Thyrotoxicosis with diffuse goiter without thyrotoxic crisis or storm: Secondary | ICD-10-CM | POA: Diagnosis not present

## 2022-05-28 LAB — TSH: TSH: 0.02 u[IU]/mL — ABNORMAL LOW (ref 0.35–5.50)

## 2022-05-28 LAB — T3, FREE: T3, Free: 3.8 pg/mL (ref 2.3–4.2)

## 2022-05-28 LAB — T4, FREE: Free T4: 1.12 ng/dL (ref 0.60–1.60)

## 2022-05-28 MED ORDER — METHIMAZOLE 5 MG PO TABS: 5.0000 mg | ORAL_TABLET | Freq: Every day | ORAL | 1 refills | Status: DC

## 2022-05-28 MED ORDER — METHIMAZOLE 5 MG PO TABS: 5.0000 mg | ORAL_TABLET | ORAL | 3 refills | Status: DC

## 2022-05-28 NOTE — Progress Notes (Signed)
Patient ID: Lawrence Harrell, male   DOB: [birth year], 77 y.o.   MRN: [id number]  HPI  Lawrence Harrell is a 77 y.o.-year-old male, returning for follow-up for thyrotoxicosis, diagnosed in 2022, with presumed diagnosis of Graves' disease.  He was previously seen by Dr. Loanne Drilling.  Last visit with me 6 months ago.  Interim hx.: At today's visit, he has no complaints. He feels he gained 3-4 lbs. No tremors, palpitations, heat intolerance, anxiety, insomnia.  Reviewed history: He was diagnosed with thyrotoxicosis in 12/2020.   Before dx., he lost weight - 30 lbs (from 170-173 lbs) in 2 months.  At last visit he was on: - Methimazole 10 mg daily (dose reduced from 10 mg 2x in 05/2021) - Toprol XL 25 mg daily At that time, we stopped methimazole due to persistently elevated TSH.  In 08/2021, we had to restart methimazole at 5 mg daily.  In 01/2022, he decreased the dose to 5 mg every other day by himself.  I reviewed pt's thyroid tests: Lab Results  Component Value Date   TSH 3.49 11/22/2021   TSH <0.01 Repeated and verified X2. (L) 09/06/2021   TSH 10.13 (H) 07/23/2021   TSH 8.09 (H) 05/15/2021   TSH 0.01 (L) 04/02/2021   TSH <0.01 02/13/2021   TSH <0.005 (L) 12/19/2020   FREET4 0.65 11/22/2021   FREET4 2.00 (H) 09/06/2021   FREET4 0.62 07/23/2021   FREET4 0.40 (L) 05/15/2021   FREET4 0.64 04/02/2021   FREET4 1.94 (H) 02/13/2021   FREET4 3.28 (H) 12/19/2020   T3FREE 3.5 11/22/2021   T3FREE 5.8 (H) 09/06/2021   T3FREE 3.3 07/23/2021   T3FREE 10.2 (H) 12/19/2020   Antithyroid antibodies: Lab Results  Component Value Date   TSI 500 (H) 07/23/2021   Pt denies: - feeling nodules in neck - hoarseness - dysphagia - choking  Pt does have a FH of thyroid ds.in sister, niece. No FH of thyroid cancer. No h/o radiation tx to head or neck. No recent contrast studies. No steroid use. No herbal supplements. No Biotin use.  Pt. also has a history of prediabetes, HTN, HL, history of renal  cell carcinoma, lipoma.  ROS: Constitutional: + see HPI  Past Medical History:  Diagnosis Date   BPH with obstruction/lower urinary tract symptoms    High cholesterol    HTN (hypertension)    Prediabetes    Renal cell cancer, right [facility])    Past Surgical History:  Procedure Laterality Date   gland extraction  09/2005   Salivary    nephrectomy  10/06/2013   partial    Social History   Socioeconomic History   Marital status: Married    Spouse name: Not on file   Number of children: Not on file   Years of education: Not on file   Highest education level: Not on file  Occupational History   Not on file  Tobacco Use   Smoking status: Never   Smokeless tobacco: Never  Vaping Use   Vaping Use: Never used  Substance and Sexual Activity   Alcohol use: Never   Drug use: Never   Sexual activity: Yes  Other Topics Concern   Not on file  Social History Narrative   Not on file   Social Determinants of Health   Financial Resource Strain: Low Risk  (05/09/2021)   Overall Financial Resource Strain (CARDIA)    Difficulty of Paying Living Expenses: Not hard at all  Food Insecurity: No Food Insecurity (05/09/2021)   Hunger Vital  Sign    Worried About Charity fundraiser in the Last Year: Never true    [place] in the Last Year: Never true  Transportation Needs: No Transportation Needs (05/09/2021)   PRAPARE - Hydrologist (Medical): No    Lack of Transportation (Non-Medical): No  Physical Activity: Insufficiently Active (05/09/2021)   Exercise Vital Sign    Days of Exercise per Week: 7 days    Minutes of Exercise per Session: 20 min  Stress: No Stress Concern Present (05/09/2021)   [place]    Feeling of Stress : Not at all  Social Connections: Not on file  Intimate Partner Violence: Not on file   Current Outpatient Medications on File Prior to Visit  Medication Sig Dispense  Refill   amLODipine (NORVASC) 10 MG tablet TAKE 1 TABLET BY MOUTH  DAILY 90 tablet 3   Cholecalciferol (VITAMIN D3) 2000 units TABS Take by mouth.     methimazole (TAPAZOLE) 5 MG tablet Take 1 tablet (5 mg total) by mouth daily. 90 tablet 3   metoprolol succinate (TOPROL-XL) 25 MG 24 hr tablet Take 1 tablet (25 mg total) by mouth daily. 90 tablet 3   pravastatin (PRAVACHOL) 40 MG tablet TAKE 1 TABLET BY MOUTH DAILY 90 tablet 3   valsartan-hydrochlorothiazide (DIOVAN-HCT) 160-25 MG tablet TAKE 1 TABLET BY MOUTH  DAILY 90 tablet 3   No current facility-administered medications on file prior to visit.   No Known Allergies Family History  Problem Relation Age of Onset   Hypertension Mother    Hypertension Father    Thyroid disease Sister    PE: BP 120/88 (BP Location: Left Arm, Patient Position: Sitting, Cuff Size: Normal)   Pulse 77   Ht 6' (1.829 m)   Wt 178 lb 3.2 oz (80.8 kg)   SpO2 99%   BMI 24.17 kg/m  Wt Readings from Last 3 Encounters:  05/28/22 178 lb 3.2 oz (80.8 kg)  04/25/22 177 lb 6.4 oz (80.5 kg)  01/22/22 175 lb 6.4 oz (79.6 kg)   Constitutional: Slightly overweight, in NAD Eyes: no exophthalmos, no lid lag, no stare ENT: no thyromegaly, no cervical lymphadenopathy Cardiovascular: RRR, No MRG Respiratory: CTA B Musculoskeletal: no deformities Skin: no rashes Neurological: no tremor with outstretched hands  ASSESSMENT: 1. Graves ds.  PLAN:  1. Patient with history of a low TSH with high free thyroid hormones, with thyrotoxic symptoms: Weight loss, heat intolerance, hyperdefecation, palpitations, anxiety, resolved after starting methimazole. -He was previously followed by Dr. Loanne Drilling.  When I first saw the patient the TSH was elevated, at 10.  TSI antibodies were elevated, confirming Graves' disease.  I advised him to stop the methimazole but return for labs to see if we need to restart at the lower dose.  Indeed, he had labs in 08/2021 and the TSH was suppressed  so methimazole was started back at a lower dose, 5 mg daily -At last visit, he was feeling well, without thyrotoxic signs or symptoms and his TFTs were normal.  At that time, I advised him to continue methimazole at the same dose. -However, at this visit, he tells me that approximately 2 months after the last set of labs, he decreased the dose of methimazole to 5 mg every other day (? Why).  He continues Toprol-XL 25 mg daily. -She tolerates methimazole well, without side effects -He feels well, without weight loss, tremors, palpitations, heat intolerance. -  Also, no signs of active Graves' ophthalmopathy: No double vision, blurry vision, eye pain, chemosis -At today's visit we will recheck his TFTs and change the methimazole dose accordingly.   -I will see him back in 6 months but possibly sooner for labs  Patient Instructions  For now, continue: - Methimazole 5 mg every other day - Toprol XL 25 mg daily  Please stop at the lab.  Please return in 6 months, but likely for labs sooner.  Component     Latest Ref Rng 05/28/2022  TSH     0.35 - 5.50 uIU/mL 0.02 (L)   T4,Free(Direct)     0.60 - 1.60 ng/dL 1.12   Triiodothyronine,Free,Serum     2.3 - 4.2 pg/mL 3.8   TSH is suppressed.  Will increase the methimazole back to 5 mg daily.  Will repeat his TFTs in 1.5 months.  Philemon Kingdom, MD PhD [facility] Endocrinology

## 2022-05-28 NOTE — Patient Instructions (Signed)
For now, continue: - Methimazole 5 mg every other day - Toprol XL 25 mg daily  Please stop at the lab.  Please return in 6 months, but likely for labs sooner.

## 2022-05-29 ENCOUNTER — Ambulatory Visit: Payer: Medicare Other | Admitting: Internal Medicine

## 2022-05-29 ENCOUNTER — Ambulatory Visit (INDEPENDENT_AMBULATORY_CARE_PROVIDER_SITE_OTHER): Payer: Medicare Other

## 2022-05-29 VITALS — Ht 72.0 in | Wt 178.0 lb

## 2022-05-29 DIAGNOSIS — Z0000 Encounter for general adult medical examination without abnormal findings: Secondary | ICD-10-CM | POA: Diagnosis not present

## 2022-05-29 NOTE — Progress Notes (Signed)
I connected with  Lawrence Harrell on 05/29/22 by a audio enabled telemedicine application and verified that I am speaking with the correct person using two identifiers.  Patient Location: Home  Provider Location: Office/Clinic  I discussed the limitations of evaluation and management by telemedicine. The patient expressed understanding and agreed to proceed.  Subjective:   Lawrence Harrell is a 77 y.o. male who presents for Medicare Annual/Subsequent preventive examination.  Review of Systems     Cardiac Risk Factors include: advanced age (>25men, >20 women);hypertension;male gender     Objective:    Today's Vitals   05/29/22 0855  Weight: 178 lb (80.7 kg)  Height: 6' (1.829 m)   Body mass index is 24.14 kg/m.     05/29/2022    8:58 AM 05/09/2021    9:02 AM 04/27/2020    8:50 AM 04/22/2019    8:51 AM  Advanced Directives  Does Patient Have a Medical Advance Directive? Yes Yes No Yes  Type of Paramedic of [place];Living will [place];Living will  [place];Living will  Copy of [place] in Chart? No - copy requested No - copy requested  No - copy requested    Current Medications (verified) Outpatient Encounter Medications as of 05/29/2022  Medication Sig   amLODipine (NORVASC) 10 MG tablet TAKE 1 TABLET BY MOUTH  DAILY   Cholecalciferol (VITAMIN D3) 2000 units TABS Take by mouth.   methimazole (TAPAZOLE) 5 MG tablet Take 1 tablet (5 mg total) by mouth daily.   metoprolol succinate (TOPROL-XL) 25 MG 24 hr tablet Take 1 tablet (25 mg total) by mouth daily.   pravastatin (PRAVACHOL) 40 MG tablet TAKE 1 TABLET BY MOUTH DAILY   valsartan-hydrochlorothiazide (DIOVAN-HCT) 160-25 MG tablet TAKE 1 TABLET BY MOUTH  DAILY   No facility-administered encounter medications on file as of 05/29/2022.    Allergies (verified) Patient has no known allergies.   History: Past Medical History:  Diagnosis Date    BPH with obstruction/lower urinary tract symptoms    High cholesterol    HTN (hypertension)    Prediabetes    Renal cell cancer, right [facility])    Past Surgical History:  Procedure Laterality Date   gland extraction  09/2005   Salivary    nephrectomy  10/06/2013   partial    Family History  Problem Relation Age of Onset   Hypertension Mother    Hypertension Father    Thyroid disease Sister    Social History   Socioeconomic History   Marital status: Married    Spouse name: Not on file   Number of children: Not on file   Years of education: Not on file   Highest education level: Not on file  Occupational History   Not on file  Tobacco Use   Smoking status: Never   Smokeless tobacco: Never  Vaping Use   Vaping Use: Never used  Substance and Sexual Activity   Alcohol use: Never   Drug use: Never   Sexual activity: Yes  Other Topics Concern   Not on file  Social History Narrative   Not on file   Social Determinants of Health   Financial Resource Strain: Low Risk  (05/29/2022)   Overall Financial Resource Strain (CARDIA)    Difficulty of Paying Living Expenses: Not hard at all  Food Insecurity: No Food Insecurity (05/29/2022)   Hunger Vital Sign    Worried About Running Out of Food in the Last Year: Never  true    Ran Out of Food in the Last Year: Never true  Transportation Needs: No Transportation Needs (05/29/2022)   PRAPARE - Hydrologist (Medical): No    Lack of Transportation (Non-Medical): No  Physical Activity: Insufficiently Active (05/29/2022)   Exercise Vital Sign    Days of Exercise per Week: 2 days    Minutes of Exercise per Session: 60 min  Stress: No Stress Concern Present (05/29/2022)   [place]    Feeling of Stress : Not at all  Social Connections: Not on file    Tobacco Counseling Counseling given: Not Answered   Clinical Intake:  Pre-visit  preparation completed: Yes  Pain : No/denies pain     Nutritional Status: BMI of 19-24  Normal Nutritional Risks: None Diabetes: No  How often do you need to have someone help you when you read instructions, pamphlets, or other written materials from your doctor or pharmacy?: 1 - Never  Diabetic? no  Interpreter Needed?: No  Information entered by :: NAllen LPN   Activities of Daily Living    05/29/2022    8:58 AM  In your present state of health, do you have any difficulty performing the following activities:  Hearing? 0  Vision? 0  Difficulty concentrating or making decisions? 0  Walking or climbing stairs? 0  Dressing or bathing? 0  Doing errands, shopping? 0  Preparing Food and eating ? N  Using the Toilet? N  In the past six months, have you accidently leaked urine? N  Do you have problems with loss of bowel control? N  Managing your Medications? N  Managing your Finances? N  Housekeeping or managing your Housekeeping? N    Patient Care Team: Glendale Chard, MD as PCP - General (Internal Medicine)  Indicate any recent Medical Services you may have received from other than [facility] providers in the past year (date may be approximate).     Assessment:   This is a routine wellness examination for Lawrence Harrell.  Hearing/Vision screen Vision Screening - Comments:: Regular eye exams, Dr. Idolina Primer  Dietary issues and exercise activities discussed: Current Exercise Habits: Home exercise routine, Time (Minutes): 60, Frequency (Times/Week): 2, Weekly Exercise (Minutes/Week): 120   Goals Addressed             This Visit's Progress    Patient Stated       05/29/2022, stay alive       Depression Screen    05/29/2022    8:58 AM 04/25/2022    8:29 AM 01/22/2022    8:26 AM 05/09/2021    9:02 AM 04/29/2021    3:33 PM 04/27/2020    8:51 AM 04/22/2019    8:52 AM  PHQ 2/9 Scores  PHQ - 2 Score 0 0 0 0 0 0 0  PHQ- 9 Score       0    Fall Risk    05/29/2022    8:58 AM  04/25/2022    8:29 AM 01/22/2022    8:26 AM 05/09/2021    9:02 AM 04/27/2020    8:50 AM  Fall Risk   Falls in the past year? 0 0 0 0 0  Number falls in past yr: 0 0 0    Injury with Fall? 0 0 0    Risk for fall due to : Medication side effect No Fall Risks No Fall Risks Medication side effect Medication side effect  Follow  up Falls prevention discussed;Education provided;Falls evaluation completed Falls evaluation completed Falls evaluation completed Falls evaluation completed;Education provided Falls evaluation completed;Education provided;Falls prevention discussed    FALL RISK PREVENTION PERTAINING TO THE HOME:  Any stairs in or around the home? Yes  If so, are there any without handrails? No  Home free of loose throw rugs in walkways, pet beds, electrical cords, etc? Yes  Adequate lighting in your home to reduce risk of falls? Yes   ASSISTIVE DEVICES UTILIZED TO PREVENT FALLS:  Life alert? No  Use of a cane, walker or w/c? No  Grab bars in the bathroom? No  Shower chair or bench in shower? No  Elevated toilet seat or a handicapped toilet? Yes   TIMED UP AND GO:  Was the test performed? No .      Cognitive Function:        05/29/2022    8:59 AM 05/09/2021    9:03 AM 04/27/2020    8:51 AM 04/22/2019    8:53 AM  6CIT Screen  What Year? 0 points 0 points 0 points 0 points  What month? 0 points 0 points 0 points 0 points  What time? 0 points 0 points 0 points 0 points  Count back from 20 0 points 0 points 0 points 0 points  Months in reverse 0 points 0 points 0 points 0 points  Repeat phrase 2 points 6 points 6 points 2 points  Total Score 2 points 6 points 6 points 2 points    Immunizations Immunization History  Administered Date(s) Administered   PFIZER(Purple Top)SARS-COV-2 Vaccination 06/03/2019, 06/29/2019, 12/28/2019, 01/07/2022   PNEUMOCOCCAL CONJUGATE-20 04/24/2021   Pfizer Covid-19 Vaccine Bivalent Booster 20yrs & up 01/24/2021   Pneumococcal  Polysaccharide-23 11/21/2011   Zoster Recombinat (Shingrix) 04/10/2021, 08/27/2021   Zoster, Live 03/11/2012    TDAP status: Due, Education has been provided regarding the importance of this vaccine. Advised may receive this vaccine at local pharmacy or Health Dept. Aware to provide a copy of the vaccination record if obtained from local pharmacy or Health Dept. Verbalized acceptance and understanding.  Flu Vaccine status: Declined, Education has been provided regarding the importance of this vaccine but patient still declined. Advised may receive this vaccine at local pharmacy or Health Dept. Aware to provide a copy of the vaccination record if obtained from local pharmacy or Health Dept. Verbalized acceptance and understanding.  Pneumococcal vaccine status: Up to date  Covid-19 vaccine status: Completed vaccines  Qualifies for Shingles Vaccine? Yes   Zostavax completed Yes   Shingrix Completed?: Yes  Screening Tests Health Maintenance  Topic Date Due   COVID-19 Vaccine (6 - 2023-24 season) 03/04/2022   Medicare Annual Wellness (AWV)  05/10/2022   INFLUENZA VACCINE  06/09/2022 (Originally 10/09/2021)   Pneumonia Vaccine 80+ Years old  Completed   Hepatitis C Screening  Completed   Zoster Vaccines- Shingrix  Completed   HPV VACCINES  Aged Out   DTaP/Tdap/Td  Discontinued   COLONOSCOPY (Pts 45-49yrs Insurance coverage will need to be confirmed)  Discontinued    Health Maintenance  Health Maintenance Due  Topic Date Due   COVID-19 Vaccine (6 - 2023-24 season) 03/04/2022   Medicare Annual Wellness (AWV)  05/10/2022    Colorectal cancer screening: No longer required.   Lung Cancer Screening: (Low Dose CT Chest recommended if Age 10-80 years, 30 pack-year currently smoking OR have quit w/in 15years.) does not qualify.   Lung Cancer Screening Referral: no  Additional Screening:  Hepatitis C Screening:  does qualify; Completed 06/18/2012  Vision Screening: Recommended annual  ophthalmology exams for early detection of glaucoma and other disorders of the eye. Is the patient up to date with their annual eye exam?  Yes  Who is the provider or what is the name of the office in which the patient attends annual eye exams? Dr. Idolina Primer If pt is not established with a provider, would they like to be referred to a provider to establish care? No .   Dental Screening: Recommended annual dental exams for proper oral hygiene  Community Resource Referral / Chronic Care Management: CRR required this visit?  No   CCM required this visit?  No      Plan:     I have personally reviewed and noted the following in the patient's chart:   Medical and social history Use of alcohol, tobacco or illicit drugs  Current medications and supplements including opioid prescriptions. Patient is not currently taking opioid prescriptions. Functional ability and status Nutritional status Physical activity Advanced directives List of other physicians Hospitalizations, surgeries, and ER visits in previous 12 months Vitals Screenings to include cognitive, depression, and falls Referrals and appointments  In addition, I have reviewed and discussed with patient certain preventive protocols, quality metrics, and best practice recommendations. A written personalized care plan for preventive services as well as general preventive health recommendations were provided to patient.     Kellie Simmering, LPN   [id number]   Nurse Notes: none  Due to this being a virtual visit, the after visit summary with patients personalized plan was offered to patient via mail or my-chart.  to pick up at office at next visit

## 2022-05-29 NOTE — Patient Instructions (Signed)
Lawrence Harrell , Thank you for taking time to come for your Medicare Wellness Visit. I appreciate your ongoing commitment to your health goals. Please review the following plan we discussed and let me know if I can assist you in the future.   These are the goals we discussed:  Goals      Patient Stated     04/22/2019, no goals     Patient Stated     04/27/2020, stay active and stay positive     Patient Stated     05/09/2021, stay healthy and stay positive     Patient Stated     05/29/2022, stay alive        This is a list of the screening recommended for you and due dates:  Health Maintenance  Topic Date Due   COVID-19 Vaccine (6 - 2023-24 season) 03/04/2022   Flu Shot  06/09/2022*   Medicare Annual Wellness Visit  05/29/2023   Pneumonia Vaccine  Completed   Hepatitis C Screening: USPSTF Recommendation to screen - Ages 18-79 yo.  Completed   Zoster (Shingles) Vaccine  Completed   HPV Vaccine  Aged Out   DTaP/Tdap/Td vaccine  Discontinued   Colon Cancer Screening  Discontinued  *Topic was postponed. The date shown is not the original due date.    Advanced directives: Please bring a copy of your POA (Power of Attorney) and/or Living Will to your next appointment.   Conditions/risks identified: none  Next appointment: Follow up in one year for your annual wellness visit.   Preventive Care 78 Years and Older, Male  Preventive care refers to lifestyle choices and visits with your health care provider that can promote health and wellness. What does preventive care include? A yearly physical exam. This is also called an annual well check. Dental exams once or twice a year. Routine eye exams. Ask your health care provider how often you should have your eyes checked. Personal lifestyle choices, including: Daily care of your teeth and gums. Regular physical activity. Eating a healthy diet. Avoiding tobacco and drug use. Limiting alcohol use. Practicing safe sex. Taking low doses  of aspirin every day. Taking vitamin and mineral supplements as recommended by your health care provider. What happens during an annual well check? The services and screenings done by your health care provider during your annual well check will depend on your age, overall health, lifestyle risk factors, and family history of disease. Counseling  Your health care provider may ask you questions about your: Alcohol use. Tobacco use. Drug use. Emotional well-being. Home and relationship well-being. Sexual activity. Eating habits. History of falls. Memory and ability to understand (cognition). Work and work Statistician. Screening  You may have the following tests or measurements: Height, weight, and BMI. Blood pressure. Lipid and cholesterol levels. These may be checked every 5 years, or more frequently if you are over 25 years old. Skin check. Lung cancer screening. You may have this screening every year starting at age 51 if you have a 30-pack-year history of smoking and currently smoke or have quit within the past 15 years. Fecal occult blood test (FOBT) of the stool. You may have this test every year starting at age 6. Flexible sigmoidoscopy or colonoscopy. You may have a sigmoidoscopy every 5 years or a colonoscopy every 10 years starting at age 49. Prostate cancer screening. Recommendations will vary depending on your family history and other risks. Hepatitis C blood test. Hepatitis B blood test. Sexually transmitted disease (STD) testing. Diabetes  screening. This is done by checking your blood sugar (glucose) after you have not eaten for a while (fasting). You may have this done every 1-3 years. Abdominal aortic aneurysm (AAA) screening. You may need this if you are a current or former smoker. Osteoporosis. You may be screened starting at age 64 if you are at high risk. Talk with your health care provider about your test results, treatment options, and if necessary, the need for  more tests. Vaccines  Your health care provider may recommend certain vaccines, such as: Influenza vaccine. This is recommended every year. Tetanus, diphtheria, and acellular pertussis (Tdap, Td) vaccine. You may need a Td booster every 10 years. Zoster vaccine. You may need this after age 78. Pneumococcal 13-valent conjugate (PCV13) vaccine. One dose is recommended after age 78. Pneumococcal polysaccharide (PPSV23) vaccine. One dose is recommended after age 78. Talk to your health care provider about which screenings and vaccines you need and how often you need them. This information is not intended to replace advice given to you by your health care provider. Make sure you discuss any questions you have with your health care provider. Document Released: 03/24/2015 Document Revised: 11/15/2015 Document Reviewed: 12/27/2014 Elsevier Interactive Patient Education  2017 [employer] Prevention in the Home Falls can cause injuries. They can happen to people of all ages. There are many things you can do to make your home safe and to help prevent falls. What can I do on the outside of my home? Regularly fix the edges of walkways and driveways and fix any cracks. Remove anything that might make you trip as you walk through a door, such as a raised step or threshold. Trim any bushes or trees on the path to your home. Use bright outdoor lighting. Clear any walking paths of anything that might make someone trip, such as rocks or tools. Regularly check to see if handrails are loose or broken. Make sure that both sides of any steps have handrails. Any raised decks and porches should have guardrails on the edges. Have any leaves, snow, or ice cleared regularly. Use sand or salt on walking paths during winter. Clean up any spills in your garage right away. This includes oil or grease spills. What can I do in the bathroom? Use night lights. Install grab bars by the toilet and in the tub and  shower. Do not use towel bars as grab bars. Use non-skid mats or decals in the tub or shower. If you need to sit down in the shower, use a plastic, non-slip stool. Keep the floor dry. Clean up any water that spills on the floor as soon as it happens. Remove soap buildup in the tub or shower regularly. Attach bath mats securely with double-sided non-slip rug tape. Do not have throw rugs and other things on the floor that can make you trip. What can I do in the bedroom? Use night lights. Make sure that you have a light by your bed that is easy to reach. Do not use any sheets or blankets that are too big for your bed. They should not hang down onto the floor. Have a firm chair that has side arms. You can use this for support while you get dressed. Do not have throw rugs and other things on the floor that can make you trip. What can I do in the kitchen? Clean up any spills right away. Avoid walking on wet floors. Keep items that you use a lot in easy-to-reach places.  If you need to reach something above you, use a strong step stool that has a grab bar. Keep electrical cords out of the way. Do not use floor polish or wax that makes floors slippery. If you must use wax, use non-skid floor wax. Do not have throw rugs and other things on the floor that can make you trip. What can I do with my stairs? Do not leave any items on the stairs. Make sure that there are handrails on both sides of the stairs and use them. Fix handrails that are broken or loose. Make sure that handrails are as long as the stairways. Check any carpeting to make sure that it is firmly attached to the stairs. Fix any carpet that is loose or worn. Avoid having throw rugs at the top or bottom of the stairs. If you do have throw rugs, attach them to the floor with carpet tape. Make sure that you have a light switch at the top of the stairs and the bottom of the stairs. If you do not have them, ask someone to add them for  you. What else can I do to help prevent falls? Wear shoes that: Do not have high heels. Have rubber bottoms. Are comfortable and fit you well. Are closed at the toe. Do not wear sandals. If you use a stepladder: Make sure that it is fully opened. Do not climb a closed stepladder. Make sure that both sides of the stepladder are locked into place. Ask someone to hold it for you, if possible. Clearly mark and make sure that you can see: Any grab bars or handrails. First and last steps. Where the edge of each step is. Use tools that help you move around (mobility aids) if they are needed. These include: Canes. Walkers. Scooters. Crutches. Turn on the lights when you go into a dark area. Replace any light bulbs as soon as they burn out. Set up your furniture so you have a clear path. Avoid moving your furniture around. If any of your floors are uneven, fix them. If there are any pets around you, be aware of where they are. Review your medicines with your doctor. Some medicines can make you feel dizzy. This can increase your chance of falling. Ask your doctor what other things that you can do to help prevent falls. This information is not intended to replace advice given to you by your health care provider. Make sure you discuss any questions you have with your health care provider. Document Released: 12/22/2008 Document Revised: 08/03/2015 Document Reviewed: 04/01/2014 Elsevier Interactive Patient Education  2017 [employer].

## 2022-06-11 ENCOUNTER — Ambulatory Visit: Payer: Medicare Other | Admitting: Internal Medicine

## 2022-07-15 ENCOUNTER — Other Ambulatory Visit (INDEPENDENT_AMBULATORY_CARE_PROVIDER_SITE_OTHER): Payer: Medicare Other

## 2022-07-15 DIAGNOSIS — E0500 Thyrotoxicosis with diffuse goiter without thyrotoxic crisis or storm: Secondary | ICD-10-CM

## 2022-07-15 LAB — TSH: TSH: 0.39 u[IU]/mL (ref 0.35–5.50)

## 2022-07-15 LAB — T4, FREE: Free T4: 0.7 ng/dL (ref 0.60–1.60)

## 2022-07-15 LAB — T3, FREE: T3, Free: 3.5 pg/mL (ref 2.3–4.2)

## 2022-09-05 ENCOUNTER — Ambulatory Visit (INDEPENDENT_AMBULATORY_CARE_PROVIDER_SITE_OTHER): Payer: Medicare Other | Admitting: Internal Medicine

## 2022-09-05 ENCOUNTER — Encounter: Payer: Self-pay | Admitting: Internal Medicine

## 2022-09-05 VITALS — BP 134/86 | HR 72 | Temp 98.1°F | Ht 72.0 in | Wt 181.0 lb

## 2022-09-05 DIAGNOSIS — R7309 Other abnormal glucose: Secondary | ICD-10-CM | POA: Diagnosis not present

## 2022-09-05 DIAGNOSIS — D696 Thrombocytopenia, unspecified: Secondary | ICD-10-CM

## 2022-09-05 DIAGNOSIS — E0590 Thyrotoxicosis, unspecified without thyrotoxic crisis or storm: Secondary | ICD-10-CM | POA: Diagnosis not present

## 2022-09-05 DIAGNOSIS — I129 Hypertensive chronic kidney disease with stage 1 through stage 4 chronic kidney disease, or unspecified chronic kidney disease: Secondary | ICD-10-CM

## 2022-09-05 DIAGNOSIS — N182 Chronic kidney disease, stage 2 (mild): Secondary | ICD-10-CM | POA: Diagnosis not present

## 2022-09-05 LAB — HEMOGLOBIN A1C
Est. average glucose Bld gHb Est-mCnc: 120 mg/dL
Hgb A1c MFr Bld: 5.8 % — ABNORMAL HIGH (ref 4.8–5.6)

## 2022-09-05 LAB — CBC
Hematocrit: 46.2 % (ref 37.5–51.0)
Hemoglobin: 14.4 g/dL (ref 13.0–17.7)
MCH: 26.2 pg — ABNORMAL LOW (ref 26.6–33.0)
MCHC: 31.2 g/dL — ABNORMAL LOW (ref 31.5–35.7)
MCV: 84 fL (ref 79–97)
Platelets: 161 10*3/uL (ref 150–450)
RBC: 5.5 x10E6/uL (ref 4.14–5.80)
RDW: 14.7 % (ref 11.6–15.4)
WBC: 5.2 10*3/uL (ref 3.4–10.8)

## 2022-09-05 LAB — CMP14+EGFR
ALT: 13 IU/L (ref 0–44)
AST: 17 IU/L (ref 0–40)
Albumin: 4.4 g/dL (ref 3.8–4.8)
Alkaline Phosphatase: 128 IU/L — ABNORMAL HIGH (ref 44–121)
BUN/Creatinine Ratio: 21 (ref 10–24)
BUN: 25 mg/dL (ref 8–27)
Bilirubin Total: 0.4 mg/dL (ref 0.0–1.2)
CO2: 29 mmol/L (ref 20–29)
Calcium: 9.7 mg/dL (ref 8.6–10.2)
Chloride: 98 mmol/L (ref 96–106)
Creatinine, Ser: 1.2 mg/dL (ref 0.76–1.27)
Globulin, Total: 3.2 g/dL (ref 1.5–4.5)
Glucose: 89 mg/dL (ref 70–99)
Potassium: 3.4 mmol/L — ABNORMAL LOW (ref 3.5–5.2)
Sodium: 142 mmol/L (ref 134–144)
Total Protein: 7.6 g/dL (ref 6.0–8.5)
eGFR: 62 mL/min/{1.73_m2} (ref >59)

## 2022-09-05 NOTE — Assessment & Plan Note (Signed)
Previous labs reviewed, platelets were slightly low.  I will recheck a CBC today.

## 2022-09-05 NOTE — Assessment & Plan Note (Signed)
Chronic, reminded to avoid NSAIDS, stay well hydrated and keep BP well controlled to decrease risk of CKD progression.

## 2022-09-05 NOTE — Assessment & Plan Note (Signed)
Chronic, recent Endo notes reviewed. He endorses compliance with methimazole 5mg  daily.

## 2022-09-05 NOTE — Assessment & Plan Note (Signed)
Chronic, fair control. Home readings are better. He will c/w valsartan/hct 160/25mg  in am and amlodipine in PM. Also advised to take metoprolol XK 25mg  nightly. He is encouraged to follow a low sodium diet.

## 2022-09-05 NOTE — Progress Notes (Signed)
I,[name], CMA,acting as a Neurosurgeon for Lawrence Aliment, MD.,have documented all relevant documentation on the behalf of Lawrence Aliment, MD,as directed by  Lawrence Aliment, MD while in the presence of Lawrence Aliment, MD.  Subjective:  Patient ID: Lawrence Harrell , male    DOB: 1945/03/12 , 77 y.o.   MRN: [id number]  Chief Complaint  Patient presents with   Hypertension   Hypothyroidism    HPI  Patient presents today for a BP check, patient reports compliance with medications. Patient has no other concerns today.  He denies having any headaches, chest pain and shortness of breath.  BP reading using home monitor 143/80. Home readings are typically less than 120/80. Also checks at [facility] frequently and it is 117/70s usually.       Hypertension This is a chronic problem. The current episode started more than 1 year ago. The problem has been gradually improving since onset. The problem is controlled. Pertinent negatives include no blurred vision, chest pain, palpitations or shortness of breath. Risk factors for coronary artery disease include dyslipidemia and male gender. The current treatment provides moderate improvement. Hypertensive end-organ damage includes kidney disease.     Past Medical History:  Diagnosis Date   BPH with obstruction/lower urinary tract symptoms    High cholesterol    HTN (hypertension)    Prediabetes    Renal cell cancer, right (HCC)      Family History  Problem Relation Age of Onset   Hypertension Mother    Hypertension Father    Thyroid disease Sister      Current Outpatient Medications:    amLODipine (NORVASC) 10 MG tablet, TAKE 1 TABLET BY MOUTH  DAILY, Disp: 90 tablet, Rfl: 3   Cholecalciferol (VITAMIN D3) 2000 units TABS, Take by mouth., Disp: , Rfl:    methimazole (TAPAZOLE) 5 MG tablet, Take 1 tablet (5 mg total) by mouth daily., Disp: 90 tablet, Rfl: 1   metoprolol succinate (TOPROL-XL) 25 MG 24 hr tablet, Take 1 tablet (25 mg  total) by mouth daily., Disp: 90 tablet, Rfl: 3   pravastatin (PRAVACHOL) 40 MG tablet, TAKE 1 TABLET BY MOUTH DAILY, Disp: 90 tablet, Rfl: 3   valsartan-hydrochlorothiazide (DIOVAN-HCT) 160-25 MG tablet, TAKE 1 TABLET BY MOUTH  DAILY, Disp: 90 tablet, Rfl: 3   No Known Allergies   Review of Systems  Constitutional: Negative.   Eyes:  Negative for blurred vision.  Respiratory: Negative.  Negative for shortness of breath.   Cardiovascular: Negative.  Negative for chest pain and palpitations.  Gastrointestinal: Negative.   Endocrine: Negative.   Musculoskeletal: Negative.   Skin: Negative.   Allergic/Immunologic: Negative.   Neurological: Negative.   Hematological: Negative.      Today's Vitals   09/05/22 0824  BP: 134/86  Pulse: 72  Temp: 98.1 F (36.7 C)  SpO2: 98%  Weight: 181 lb (82.1 kg)  Height: 6' (1.829 m)   Body mass index is 24.55 kg/m.  Wt Readings from Last 3 Encounters:  09/05/22 181 lb (82.1 kg)  05/29/22 178 lb (80.7 kg)  05/28/22 178 lb 3.2 oz (80.8 kg)     Objective:  Physical Exam Vitals and nursing note reviewed.  Constitutional:      Appearance: Normal appearance.  HENT:     Head: Normocephalic and atraumatic.  Eyes:     Extraocular Movements: Extraocular movements intact.  Cardiovascular:     Rate and Rhythm: Normal rate and regular rhythm.     Heart sounds:  Normal heart sounds.  Pulmonary:     Effort: Pulmonary effort is normal.     Breath sounds: Normal breath sounds.  Musculoskeletal:     Cervical back: Normal range of motion.  Skin:    General: Skin is warm.  Neurological:     General: No focal deficit present.     Mental Status: He is alert.  Psychiatric:        Mood and Affect: Mood normal.         Assessment And Plan:  Hypertensive nephropathy Assessment & Plan: Chronic, fair control. Home readings are better. He will c/w valsartan/hct 160/25mg  in am and amlodipine in PM. Also advised to take metoprolol XK 25mg  nightly. He  is encouraged to follow a low sodium diet.   Orders: -     CMP14+EGFR  Chronic renal disease, stage II Assessment & Plan: Chronic, reminded to avoid NSAIDS, stay well hydrated and keep BP well controlled to decrease risk of CKD progression.   Orders: -     CMP14+EGFR  Hyperthyroidism Assessment & Plan: Chronic, recent Endo notes reviewed. He endorses compliance with methimazole 5mg  daily.    Other abnormal glucose Assessment & Plan: HIS A1C HAS BEEN ELEVATED IN THE PAST. I WILL CHECK AN A1C, BMET TODAY. HE IS ENCOURAGED TO AVOID SUGARY BEVERAGES AND PROCESSED FOODS INCLUDNG BREADS, RICE AND PASTA.   Orders: -     Hemoglobin A1c  Thrombocytopenia (HCC) Assessment & Plan: Previous labs reviewed, platelets were slightly low.  I will recheck a CBC today.   Orders: -     CBC     Return for 4 month bpc. .  Patient was given opportunity to ask questions. Patient verbalized understanding of the plan and was able to repeat key elements of the plan. All questions were answered to their satisfaction.     IF YOU HAVE BEEN REFERRED TO A SPECIALIST, IT MAY TAKE 1-2 WEEKS TO SCHEDULE/PROCESS THE REFERRAL. IF YOU HAVE NOT HEARD FROM US/SPECIALIST IN TWO WEEKS, PLEASE GIVE [place] A CALL AT [phone].   THE PATIENT IS ENCOURAGED TO PRACTICE SOCIAL DISTANCING DUE TO THE COVID-19 PANDEMIC.

## 2022-09-05 NOTE — Patient Instructions (Signed)
Hypertension, Adult Hypertension is another name for high blood pressure. High blood pressure forces your heart to work harder to pump blood. This can cause problems over time. There are two numbers in a blood pressure reading. There is a top number (systolic) over a bottom number (diastolic). It is best to have a blood pressure that is below 120/80. What are the causes? The cause of this condition is not known. Some other conditions can lead to high blood pressure. What increases the risk? Some lifestyle factors can make you more likely to develop high blood pressure: Smoking. Not getting enough exercise or physical activity. Being overweight. Having too much fat, sugar, calories, or salt (sodium) in your diet. Drinking too much alcohol. Other risk factors include: Having any of these conditions: Heart disease. Diabetes. High cholesterol. Kidney disease. Obstructive sleep apnea. Having a family history of high blood pressure and high cholesterol. Age. The risk increases with age. Stress. What are the signs or symptoms? High blood pressure may not cause symptoms. Very high blood pressure (hypertensive crisis) may cause: Headache. Fast or uneven heartbeats (palpitations). Shortness of breath. Nosebleed. Vomiting or feeling like you may vomit (nauseous). Changes in how you see. Very bad chest pain. Feeling dizzy. Seizures. How is this treated? This condition is treated by making healthy lifestyle changes, such as: Eating healthy foods. Exercising more. Drinking less alcohol. Your doctor may prescribe medicine if lifestyle changes do not help enough and if: Your top number is above 130. Your bottom number is above 80. Your personal target blood pressure may vary. Follow these instructions at home: Eating and drinking  If told, follow the DASH eating plan. To follow this plan: Fill one half of your plate at each meal with fruits and vegetables. Fill one fourth of your plate  at each meal with whole grains. Whole grains include whole-wheat pasta, brown rice, and whole-grain bread. Eat or drink low-fat dairy products, such as skim milk or low-fat yogurt. Fill one fourth of your plate at each meal with low-fat (lean) proteins. Low-fat proteins include fish, chicken without skin, eggs, beans, and tofu. Avoid fatty meat, cured and processed meat, or chicken with skin. Avoid pre-made or processed food. Limit the amount of salt in your diet to less than 1,500 mg each day. Do not drink alcohol if: Your doctor tells you not to drink. You are pregnant, may be pregnant, or are planning to become pregnant. If you drink alcohol: Limit how much you have to: 0-1 drink a day for women. 0-2 drinks a day for men. Know how much alcohol is in your drink. In the [place] one drink equals one 12 oz bottle of beer (355 mL), one 5 oz glass of wine (148 mL), or one 1 oz glass of hard liquor (44 mL). Lifestyle  Work with your doctor to stay at a healthy weight or to lose weight. Ask your doctor what the best weight is for you. Get at least 30 minutes of exercise that causes your heart to beat faster (aerobic exercise) most days of the week. This may include walking, swimming, or biking. Get at least 30 minutes of exercise that strengthens your muscles (resistance exercise) at least 3 days a week. This may include lifting weights or doing Pilates. Do not smoke or use any products that contain nicotine or tobacco. If you need help quitting, ask your doctor. Check your blood pressure at home as told by your doctor. Keep all follow-up visits. Medicines Take over-the-counter and prescription medicines   only as told by your doctor. Follow directions carefully. Do not skip doses of blood pressure medicine. The medicine does not work as well if you skip doses. Skipping doses also puts you at risk for problems. Ask your doctor about side effects or reactions to medicines that you should watch  for. Contact a doctor if: You think you are having a reaction to the medicine you are taking. You have headaches that keep coming back. You feel dizzy. You have swelling in your ankles. You have trouble with your vision. Get help right away if: You get a very bad headache. You start to feel mixed up (confused). You feel weak or numb. You feel faint. You have very bad pain in your: Chest. Belly (abdomen). You vomit more than once. You have trouble breathing. These symptoms may be an emergency. Get help right away. Call 911. Do not wait to see if the symptoms will go away. Do not drive yourself to the hospital. Summary Hypertension is another name for high blood pressure. High blood pressure forces your heart to work harder to pump blood. For most people, a normal blood pressure is less than 120/80. Making healthy choices can help lower blood pressure. If your blood pressure does not get lower with healthy choices, you may need to take medicine. This information is not intended to replace advice given to you by your health care provider. Make sure you discuss any questions you have with your health care provider. Document Revised: 12/14/2020 Document Reviewed: 12/14/2020 Elsevier Patient Education  2024 [employer]  

## 2022-09-05 NOTE — Assessment & Plan Note (Signed)
HIS A1C HAS BEEN ELEVATED IN THE PAST. I WILL CHECK AN A1C, BMET TODAY. HE IS ENCOURAGED TO AVOID SUGARY BEVERAGES AND PROCESSED FOODS INCLUDNG BREADS, RICE AND PASTA.

## 2022-10-26 ENCOUNTER — Other Ambulatory Visit: Payer: Self-pay | Admitting: Internal Medicine

## 2022-11-05 ENCOUNTER — Other Ambulatory Visit: Payer: Self-pay | Admitting: Internal Medicine

## 2022-12-03 ENCOUNTER — Encounter: Payer: Self-pay | Admitting: Internal Medicine

## 2022-12-03 ENCOUNTER — Ambulatory Visit: Payer: Medicare Other | Admitting: Internal Medicine

## 2022-12-03 VITALS — BP 150/80 | HR 81 | Ht 72.0 in | Wt 180.2 lb

## 2022-12-03 DIAGNOSIS — E0500 Thyrotoxicosis with diffuse goiter without thyrotoxic crisis or storm: Secondary | ICD-10-CM

## 2022-12-03 LAB — TSH: TSH: 2.67 u[IU]/mL (ref 0.35–5.50)

## 2022-12-03 LAB — T4, FREE: Free T4: 0.73 ng/dL (ref 0.60–1.60)

## 2022-12-03 LAB — T3, FREE: T3, Free: 3.6 pg/mL (ref 2.3–4.2)

## 2022-12-03 NOTE — Progress Notes (Unsigned)
Patient ID: Lawrence Harrell, male   DOB: 1945-03-18, 77 y.o.   MRN: [id number]  HPI  Lawrence Harrell is a 77 y.o.-year-old male, returning for follow-up for thyrotoxicosis, diagnosed in 2022, with presumed diagnosis of Graves' disease.  He was previously seen by Dr. Everardo All.  Last visit with me 6 months ago.  Interim hx.: At today's visit, he has no complaints.  No tremors, palpitations, heat intolerance, anxiety, insomnia.  Reviewed history: He was diagnosed with thyrotoxicosis in 12/2020.   Before dx., he lost weight - 30 lbs (from 170-173 lbs) in 2 months.  At last visit he was on: - Methimazole 10 mg daily (dose reduced from 10 mg 2x in 05/2021) - Toprol XL 25 mg daily At that time, we stopped methimazole due to persistently elevated TSH.  In 08/2021, we had to restart methimazole at 5 mg daily.  In 01/2022, he decreased the dose to 5 mg every other day by himself.  In 05/2022, we increased the dose back to 5 mg daily.  I reviewed pt's thyroid tests: Lab Results  Component Value Date   TSH 0.39 07/15/2022   TSH 0.02 (L) 05/28/2022   TSH 3.49 11/22/2021   TSH <0.01 Repeated and verified X2. (L) 09/06/2021   TSH 10.13 (H) 07/23/2021   TSH 8.09 (H) 05/15/2021   TSH 0.01 (L) 04/02/2021   TSH <0.01 02/13/2021   TSH <0.005 (L) 12/19/2020   FREET4 0.70 07/15/2022   FREET4 1.12 05/28/2022   FREET4 0.65 11/22/2021   FREET4 2.00 (H) 09/06/2021   FREET4 0.62 07/23/2021   FREET4 0.40 (L) 05/15/2021   FREET4 0.64 04/02/2021   FREET4 1.94 (H) 02/13/2021   FREET4 3.28 (H) 12/19/2020   T3FREE 3.5 07/15/2022   T3FREE 3.8 05/28/2022   T3FREE 3.5 11/22/2021   T3FREE 5.8 (H) 09/06/2021   T3FREE 3.3 07/23/2021   T3FREE 10.2 (H) 12/19/2020   Antithyroid antibodies: Lab Results  Component Value Date   TSI 500 (H) 07/23/2021   Pt denies: - feeling nodules in neck - hoarseness - dysphagia - choking  Pt does have a FH of thyroid ds.in sister, niece. No FH of thyroid cancer. No  h/o radiation tx to head or neck. No recent contrast studies. No steroid use. No herbal supplements. No Biotin use.  Pt. also has a history of prediabetes, HTN, HL, history of renal cell carcinoma, lipoma.  ROS: Constitutional: + see HPI  Past Medical History:  Diagnosis Date   BPH with obstruction/lower urinary tract symptoms    High cholesterol    HTN (hypertension)    Prediabetes    Renal cell cancer, right [facility])    Past Surgical History:  Procedure Laterality Date   gland extraction  09/2005   Salivary    nephrectomy  10/06/2013   partial    Social History   Socioeconomic History   Marital status: Married    Spouse name: Not on file   Number of children: Not on file   Years of education: Not on file   Highest education level: Not on file  Occupational History   Not on file  Tobacco Use   Smoking status: Never   Smokeless tobacco: Never  Vaping Use   Vaping status: Never Used  Substance and Sexual Activity   Alcohol use: Never   Drug use: Never   Sexual activity: Yes  Other Topics Concern   Not on file  Social History Narrative   Not on file   Social Determinants of Health  Financial Resource Strain: Low Risk  (05/29/2022)   Overall Financial Resource Strain (CARDIA)    Difficulty of Paying Living Expenses: Not hard at all  Food Insecurity: No Food Insecurity (05/29/2022)   Hunger Vital Sign    Worried About Running Out of Food in the Last Year: Never true    Ran Out of Food in the Last Year: Never true  Transportation Needs: No Transportation Needs (05/29/2022)   PRAPARE - Administrator, Civil Service (Medical): No    Lack of Transportation (Non-Medical): No  Physical Activity: Insufficiently Active (05/29/2022)   Exercise Vital Sign    Days of Exercise per Week: 2 days    Minutes of Exercise per Session: 60 min  Stress: No Stress Concern Present (05/29/2022)   [name] of Occupational Health - Occupational Stress Questionnaire     Feeling of Stress : Not at all  Social Connections: Not on file  Intimate Partner Violence: Not on file   Current Outpatient Medications on File Prior to Visit  Medication Sig Dispense Refill   amLODipine (NORVASC) 10 MG tablet TAKE 1 TABLET BY MOUTH DAILY 90 tablet 3   Cholecalciferol (VITAMIN D3) 2000 units TABS Take by mouth.     methimazole (TAPAZOLE) 5 MG tablet Take 1 tablet (5 mg total) by mouth daily. 90 tablet 1   metoprolol succinate (TOPROL-XL) 25 MG 24 hr tablet Take 1 tablet (25 mg total) by mouth daily. 90 tablet 3   pravastatin (PRAVACHOL) 40 MG tablet TAKE 1 TABLET BY MOUTH DAILY 100 tablet 2   valsartan-hydrochlorothiazide (DIOVAN-HCT) 160-25 MG tablet TAKE 1 TABLET BY MOUTH DAILY 90 tablet 3   No current facility-administered medications on file prior to visit.   No Known Allergies Family History  Problem Relation Age of Onset   Hypertension Mother    Hypertension Father    Thyroid disease Sister    PE: BP (!) 150/80   Pulse 81   Ht 6' (1.829 m)   Wt 180 lb 3.2 oz (81.7 kg)   SpO2 98%   BMI 24.44 kg/m  Wt Readings from Last 10 Encounters:  12/03/22 180 lb 3.2 oz (81.7 kg)  09/05/22 181 lb (82.1 kg)  05/29/22 178 lb (80.7 kg)  05/28/22 178 lb 3.2 oz (80.8 kg)  04/25/22 177 lb 6.4 oz (80.5 kg)  01/22/22 175 lb 6.4 oz (79.6 kg)  11/22/21 174 lb (78.9 kg)  08/27/21 171 lb 12.8 oz (77.9 kg)  07/23/21 174 lb 6.4 oz (79.1 kg)  05/15/21 171 lb (77.6 kg)   Constitutional: Slightly overweight, in NAD Eyes: no exophthalmos, no lid lag, no stare ENT: no thyromegaly, no cervical lymphadenopathy Cardiovascular: RRR, No MRG Respiratory: CTA B Musculoskeletal: no deformities Skin: no rashes Neurological: no tremor with outstretched hands  ASSESSMENT: 1. Graves ds.  PLAN:  1. Patient with history of low TSH with high free thyroid hormones, with thyrotoxic symptoms weight loss, heat intolerance, hyperdefecation, palpitations, anxiety, all resolved after starting  methimazole. -He was previously followed by Dr. Everardo All.  When I first saw the patient, a TSH was elevated, at 10.  TSI antibodies returned elevated, confirming Graves' disease.  I advised him to stop methimazole and return for labs.  The TSH was suppressed again so methimazole was started back at the lower dose, 5 mg daily.  However, he subsequently decreased the dose of methimazole to 5 mg every other day (unclear why).  At last visit, and 05/2022, on the above dose, his TSH was  suppressed so we increased the dose of methimazole back to 5 mg daily.  He now continues on this dose. -Latest TFTs reviewed with the patient: Normal in 07/2022 -At today's visit, he denies weight loss, tremors, palpitations, heat intolerance -He tolerates methimazole well, without side effects -There are no signs of active Graves' ophthalmopathy: No double vision, blurry vision, eye pain, chemosis -At today's visit, we will recheck his TFTs and change the methimazole dose accordingly.  Will also check his TSI's again today to see if they improved. -For now we will continue the Toprol-XL 25 mg daily.  No tachycardia at today's exam. -I will have her return in 6 months but possibly sooner for labs  Patient Instructions  For now, continue: - Methimazole 5 mg every day - Toprol XL 25 mg daily  Please stop at the lab.  Please return in 6 months, but possibly sooner for labs.  Needs refills.  Office Visit on 12/03/2022  Component Date Value Ref Range Status   TSH 12/03/2022 2.67  0.35 - 5.50 uIU/mL Final   Free T4 12/03/2022 0.73  0.60 - 1.60 ng/dL Final   Comment: Specimens from patients who are undergoing biotin therapy and /or ingesting biotin supplements may contain high levels of biotin.  The higher biotin concentration in these specimens interferes with this Free T4 assay.  Specimens that contain high levels  of biotin may cause false high results for this Free T4 assay.  Please interpret results in light of the  total clinical presentation of the patient.     T3, Free 12/03/2022 3.6  2.3 - 4.2 pg/mL Final  TFTs are normal.  Plan to continue methimazole 5 mg every other day. TSIs still pending.  Carlus Pavlov, MD PhD [facility]

## 2022-12-03 NOTE — Patient Instructions (Addendum)
For now, continue: - Methimazole 5 mg daily - Toprol XL 25 mg daily  Please stop at the lab.  Please return in 6 months, but possibly sooner for labs.

## 2022-12-04 ENCOUNTER — Encounter: Payer: Self-pay | Admitting: Internal Medicine

## 2022-12-05 ENCOUNTER — Telehealth: Payer: Self-pay

## 2022-12-05 LAB — THYROID STIMULATING IMMUNOGLOBULIN: TSI: 406 % baseline — ABNORMAL HIGH (ref <140)

## 2022-12-05 NOTE — Telephone Encounter (Signed)
Thyroid function Test results given, no further questions from patient at this time

## 2022-12-05 NOTE — Telephone Encounter (Signed)
-----   Message from Carlus Pavlov sent at 12/05/2022  4:27 PM EDT ----- Can you please call pt.:  Thyroid function tests are normal so he can continue the same dose of methimazole.  His Graves' antibodies are still elevated, but improving.

## 2022-12-29 ENCOUNTER — Other Ambulatory Visit: Payer: Self-pay | Admitting: Internal Medicine

## 2022-12-29 DIAGNOSIS — E0590 Thyrotoxicosis, unspecified without thyrotoxic crisis or storm: Secondary | ICD-10-CM

## 2023-01-13 ENCOUNTER — Ambulatory Visit: Payer: Medicare Other | Admitting: Internal Medicine

## 2023-01-14 ENCOUNTER — Ambulatory Visit (INDEPENDENT_AMBULATORY_CARE_PROVIDER_SITE_OTHER): Payer: Medicare Other | Admitting: Internal Medicine

## 2023-01-14 ENCOUNTER — Encounter: Payer: Self-pay | Admitting: Internal Medicine

## 2023-01-14 VITALS — BP 122/78 | HR 84 | Temp 97.7°F | Ht 72.0 in | Wt 178.6 lb

## 2023-01-14 DIAGNOSIS — Z2821 Immunization not carried out because of patient refusal: Secondary | ICD-10-CM

## 2023-01-14 DIAGNOSIS — E0590 Thyrotoxicosis, unspecified without thyrotoxic crisis or storm: Secondary | ICD-10-CM

## 2023-01-14 DIAGNOSIS — N182 Chronic kidney disease, stage 2 (mild): Secondary | ICD-10-CM | POA: Diagnosis not present

## 2023-01-14 DIAGNOSIS — I129 Hypertensive chronic kidney disease with stage 1 through stage 4 chronic kidney disease, or unspecified chronic kidney disease: Secondary | ICD-10-CM

## 2023-01-14 DIAGNOSIS — R7309 Other abnormal glucose: Secondary | ICD-10-CM

## 2023-01-14 NOTE — Assessment & Plan Note (Signed)
Chronic, reminded to avoid NSAIDS, stay well hydrated and keep BP well controlled to decrease risk of CKD progression.

## 2023-01-14 NOTE — Assessment & Plan Note (Signed)
HIS A1C HAS BEEN ELEVATED IN THE PAST. I WILL CHECK AN A1C, BMET TODAY. HE IS ENCOURAGED TO AVOID SUGARY BEVERAGES AND PROCESSED FOODS INCLUDNG BREADS, RICE AND PASTA.

## 2023-01-14 NOTE — Progress Notes (Signed)
I,Lawrence Harrell, CMA,acting as a Neurosurgeon for Lawrence Aliment, MD.,have documented all relevant documentation on the behalf of Lawrence Aliment, MD,as directed by  Lawrence Aliment, MD while in the presence of Lawrence Aliment, MD.  Subjective:  Patient ID: Lawrence Harrell , male    DOB: 06/18/1945 , 77 y.o.   MRN: [id number]  Chief Complaint  Patient presents with   Hypertension   Hypothyroidism    HPI  The patient is here today for a follow-up on HTN and hyperlipidemia. The patient reports compliance with his medications. He denies headaches, chest pain and shortness of breath. He reports no specific questions or concerns.   He denies having any hospitalizations since his last visit. He has not seen any specialists either.   Hypertension This is a chronic problem. The current episode started more than 1 year ago. The problem has been gradually improving since onset. The problem is controlled. Pertinent negatives include no blurred vision, chest pain, palpitations or shortness of breath. Risk factors for coronary artery disease include dyslipidemia and male gender. The current treatment provides moderate improvement. Hypertensive end-organ damage includes kidney disease.     Past Medical History:  Diagnosis Date   BPH with obstruction/lower urinary tract symptoms    High cholesterol    HTN (hypertension)    Prediabetes    Renal cell cancer, right (HCC)      Family History  Problem Relation Age of Onset   Hypertension Mother    Hypertension Father    Thyroid disease Sister      Current Outpatient Medications:    amLODipine (NORVASC) 10 MG tablet, TAKE 1 TABLET BY MOUTH DAILY, Disp: 90 tablet, Rfl: 3   Cholecalciferol (VITAMIN D3) 2000 units TABS, Take by mouth., Disp: , Rfl:    methimazole (TAPAZOLE) 5 MG tablet, Take 1 tablet (5 mg total) by mouth daily., Disp: 90 tablet, Rfl: 1   metoprolol succinate (TOPROL-XL) 25 MG 24 hr tablet, TAKE 1 TABLET BY MOUTH DAILY, Disp: 90  tablet, Rfl: 3   pravastatin (PRAVACHOL) 40 MG tablet, TAKE 1 TABLET BY MOUTH DAILY, Disp: 100 tablet, Rfl: 2   valsartan-hydrochlorothiazide (DIOVAN-HCT) 160-25 MG tablet, TAKE 1 TABLET BY MOUTH DAILY, Disp: 90 tablet, Rfl: 3   No Known Allergies   Review of Systems  Constitutional: Negative.   HENT: Negative.    Eyes:  Negative for blurred vision.  Respiratory:  Negative for shortness of breath.   Cardiovascular: Negative.  Negative for chest pain and palpitations.  Genitourinary: Negative.   Skin: Negative.   Allergic/Immunologic: Negative.   Neurological: Negative.   Hematological: Negative.      Today's Vitals   01/14/23 0827  BP: 122/78  Pulse: 84  Temp: 97.7 F (36.5 C)  SpO2: 98%  Weight: 178 lb 9.6 oz (81 kg)  Height: 6' (1.829 m)   Body mass index is 24.22 kg/m.  Wt Readings from Last 3 Encounters:  01/14/23 178 lb 9.6 oz (81 kg)  12/03/22 180 lb 3.2 oz (81.7 kg)  09/05/22 181 lb (82.1 kg)    BP Readings from Last 3 Encounters:  01/14/23 122/78  12/03/22 (!) 150/80  09/05/22 134/86     Objective:  Physical Exam Vitals and nursing note reviewed.  Constitutional:      Appearance: Normal appearance.  Eyes:     Extraocular Movements: Extraocular movements intact.  Cardiovascular:     Rate and Rhythm: Normal rate and regular rhythm.     Heart sounds: Normal  heart sounds.  Pulmonary:     Effort: Pulmonary effort is normal.     Breath sounds: Normal breath sounds.  Skin:    General: Skin is warm.  Neurological:     General: No focal deficit present.     Mental Status: He is alert.  Psychiatric:        Mood and Affect: Mood normal.         Assessment And Plan:  Hypertensive nephropathy Assessment & Plan: Chronic, controlled. He will c/w valsartan/hct 160/25mg  in am and amlodipine 10mg  in PM. Also advised to take metoprolol XK 25mg  nightly. He is encouraged to follow a low sodium diet. He will f/u in four to six months.   Orders: -      BMP8+eGFR  Chronic renal disease, stage II Assessment & Plan: Chronic, reminded to avoid NSAIDS, stay well hydrated and keep BP well controlled to decrease risk of CKD progression.   Orders: -     BMP8+eGFR  Hyperthyroidism Assessment & Plan: Chronic, recent Endo notes reviewed. He endorses compliance with methimazole 5mg  daily. He denies having any palpitations, diarrhea and dizziness.    Other abnormal glucose Assessment & Plan: HIS A1C HAS BEEN ELEVATED IN THE PAST. I WILL CHECK AN A1C, BMET TODAY. HE IS ENCOURAGED TO AVOID SUGARY BEVERAGES AND PROCESSED FOODS INCLUDNG BREADS, RICE AND PASTA.   Orders: -     Hemoglobin A1c -     BMP8+eGFR  Immunization declined     Return if symptoms worsen or fail to improve.  Patient was given opportunity to ask questions. Patient verbalized understanding of the plan and was able to repeat key elements of the plan. All questions were answered to their satisfaction.    I, Lawrence Aliment, MD, have reviewed all documentation for this visit. The documentation on 01/14/23 for the exam, diagnosis, procedures, and orders are all accurate and complete.   IF YOU HAVE BEEN REFERRED TO A SPECIALIST, IT MAY TAKE 1-2 WEEKS TO SCHEDULE/PROCESS THE REFERRAL. IF YOU HAVE NOT HEARD FROM US/SPECIALIST IN TWO WEEKS, PLEASE GIVE [place] A CALL AT [phone].   THE PATIENT IS ENCOURAGED TO PRACTICE SOCIAL DISTANCING DUE TO THE COVID-19 PANDEMIC.

## 2023-01-14 NOTE — Assessment & Plan Note (Addendum)
Chronic, controlled. He will c/w valsartan/hct 160/25mg  in am and amlodipine 10mg  in PM. Also advised to take metoprolol XK 25mg  nightly. He is encouraged to follow a low sodium diet. He will f/u in four to six months.

## 2023-01-14 NOTE — Patient Instructions (Signed)
Hypertension, Adult Hypertension is another name for high blood pressure. High blood pressure forces your heart to work harder to pump blood. This can cause problems over time. There are two numbers in a blood pressure reading. There is a top number (systolic) over a bottom number (diastolic). It is best to have a blood pressure that is below 120/80. What are the causes? The cause of this condition is not known. Some other conditions can lead to high blood pressure. What increases the risk? Some lifestyle factors can make you more likely to develop high blood pressure: Smoking. Not getting enough exercise or physical activity. Being overweight. Having too much fat, sugar, calories, or salt (sodium) in your diet. Drinking too much alcohol. Other risk factors include: Having any of these conditions: Heart disease. Diabetes. High cholesterol. Kidney disease. Obstructive sleep apnea. Having a family history of high blood pressure and high cholesterol. Age. The risk increases with age. Stress. What are the signs or symptoms? High blood pressure may not cause symptoms. Very high blood pressure (hypertensive crisis) may cause: Headache. Fast or uneven heartbeats (palpitations). Shortness of breath. Nosebleed. Vomiting or feeling like you may vomit (nauseous). Changes in how you see. Very bad chest pain. Feeling dizzy. Seizures. How is this treated? This condition is treated by making healthy lifestyle changes, such as: Eating healthy foods. Exercising more. Drinking less alcohol. Your doctor may prescribe medicine if lifestyle changes do not help enough and if: Your top number is above 130. Your bottom number is above 80. Your personal target blood pressure may vary. Follow these instructions at home: Eating and drinking  If told, follow the DASH eating plan. To follow this plan: Fill one half of your plate at each meal with fruits and vegetables. Fill one fourth of your plate  at each meal with whole grains. Whole grains include whole-wheat pasta, brown rice, and whole-grain bread. Eat or drink low-fat dairy products, such as skim milk or low-fat yogurt. Fill one fourth of your plate at each meal with low-fat (lean) proteins. Low-fat proteins include fish, chicken without skin, eggs, beans, and tofu. Avoid fatty meat, cured and processed meat, or chicken with skin. Avoid pre-made or processed food. Limit the amount of salt in your diet to less than 1,500 mg each day. Do not drink alcohol if: Your doctor tells you not to drink. You are pregnant, may be pregnant, or are planning to become pregnant. If you drink alcohol: Limit how much you have to: 0-1 drink a day for women. 0-2 drinks a day for men. Know how much alcohol is in your drink. In the [place] one drink equals one 12 oz bottle of beer (355 mL), one 5 oz glass of wine (148 mL), or one 1 oz glass of hard liquor (44 mL). Lifestyle  Work with your doctor to stay at a healthy weight or to lose weight. Ask your doctor what the best weight is for you. Get at least 30 minutes of exercise that causes your heart to beat faster (aerobic exercise) most days of the week. This may include walking, swimming, or biking. Get at least 30 minutes of exercise that strengthens your muscles (resistance exercise) at least 3 days a week. This may include lifting weights or doing Pilates. Do not smoke or use any products that contain nicotine or tobacco. If you need help quitting, ask your doctor. Check your blood pressure at home as told by your doctor. Keep all follow-up visits. Medicines Take over-the-counter and prescription medicines   only as told by your doctor. Follow directions carefully. Do not skip doses of blood pressure medicine. The medicine does not work as well if you skip doses. Skipping doses also puts you at risk for problems. Ask your doctor about side effects or reactions to medicines that you should watch  for. Contact a doctor if: You think you are having a reaction to the medicine you are taking. You have headaches that keep coming back. You feel dizzy. You have swelling in your ankles. You have trouble with your vision. Get help right away if: You get a very bad headache. You start to feel mixed up (confused). You feel weak or numb. You feel faint. You have very bad pain in your: Chest. Belly (abdomen). You vomit more than once. You have trouble breathing. These symptoms may be an emergency. Get help right away. Call 911. Do not wait to see if the symptoms will go away. Do not drive yourself to the hospital. Summary Hypertension is another name for high blood pressure. High blood pressure forces your heart to work harder to pump blood. For most people, a normal blood pressure is less than 120/80. Making healthy choices can help lower blood pressure. If your blood pressure does not get lower with healthy choices, you may need to take medicine. This information is not intended to replace advice given to you by your health care provider. Make sure you discuss any questions you have with your health care provider. Document Revised: 12/14/2020 Document Reviewed: 12/14/2020 Elsevier Patient Education  2024 [employer]  

## 2023-01-14 NOTE — Assessment & Plan Note (Signed)
Chronic, recent Endo notes reviewed. He endorses compliance with methimazole 5mg  daily. He denies having any palpitations, diarrhea and dizziness.

## 2023-01-15 LAB — BMP8+EGFR
BUN/Creatinine Ratio: 16 (ref 10–24)
BUN: 20 mg/dL (ref 8–27)
CO2: 28 mmol/L (ref 20–29)
Calcium: 9.3 mg/dL (ref 8.6–10.2)
Chloride: 99 mmol/L (ref 96–106)
Creatinine, Ser: 1.25 mg/dL (ref 0.76–1.27)
Glucose: 90 mg/dL (ref 70–99)
Potassium: 3.4 mmol/L — ABNORMAL LOW (ref 3.5–5.2)
Sodium: 142 mmol/L (ref 134–144)
eGFR: 59 mL/min/{1.73_m2} — ABNORMAL LOW (ref >59)

## 2023-01-15 LAB — HEMOGLOBIN A1C
Est. average glucose Bld gHb Est-mCnc: 126 mg/dL
Hgb A1c MFr Bld: 6 % — ABNORMAL HIGH (ref 4.8–5.6)

## 2023-05-13 ENCOUNTER — Encounter: Payer: Self-pay | Admitting: Internal Medicine

## 2023-05-13 ENCOUNTER — Ambulatory Visit: Payer: Self-pay | Admitting: Internal Medicine

## 2023-05-13 VITALS — BP 134/80 | HR 74 | Temp 97.9°F | Ht 72.0 in | Wt 182.4 lb

## 2023-05-13 DIAGNOSIS — D696 Thrombocytopenia, unspecified: Secondary | ICD-10-CM | POA: Diagnosis not present

## 2023-05-13 DIAGNOSIS — E0590 Thyrotoxicosis, unspecified without thyrotoxic crisis or storm: Secondary | ICD-10-CM | POA: Diagnosis not present

## 2023-05-13 DIAGNOSIS — Z0000 Encounter for general adult medical examination without abnormal findings: Secondary | ICD-10-CM | POA: Insufficient documentation

## 2023-05-13 DIAGNOSIS — N182 Chronic kidney disease, stage 2 (mild): Secondary | ICD-10-CM | POA: Diagnosis not present

## 2023-05-13 DIAGNOSIS — R7309 Other abnormal glucose: Secondary | ICD-10-CM

## 2023-05-13 DIAGNOSIS — I129 Hypertensive chronic kidney disease with stage 1 through stage 4 chronic kidney disease, or unspecified chronic kidney disease: Secondary | ICD-10-CM

## 2023-05-13 DIAGNOSIS — R1900 Intra-abdominal and pelvic swelling, mass and lump, unspecified site: Secondary | ICD-10-CM | POA: Diagnosis not present

## 2023-05-13 LAB — POCT URINALYSIS DIPSTICK
Bilirubin, UA: NEGATIVE
Blood, UA: NEGATIVE
Glucose, UA: NEGATIVE — NL
Ketones, UA: NEGATIVE
Leukocytes, UA: NEGATIVE — NL
Nitrite, UA: NEGATIVE
Protein, UA: NEGATIVE
Spec Grav, UA: 1.02 (ref 1.010–1.025)
Urobilinogen, UA: 0.2 U/dL
pH, UA: 6 (ref 5.0–8.0)

## 2023-05-13 NOTE — Assessment & Plan Note (Signed)
HIS A1C HAS BEEN ELEVATED IN THE PAST. I WILL CHECK AN A1C, BMET TODAY. HE IS ENCOURAGED TO AVOID SUGARY BEVERAGES AND PROCESSED FOODS INCLUDNG BREADS, RICE AND PASTA.

## 2023-05-13 NOTE — Assessment & Plan Note (Addendum)
 Chronic, fair control. Goal BP<130/80. EKG performed, NSR w/ occasional PAC, voltage criteria for LVH.  He will c/w valsartan/hct 160/25mg  in am and amlodipine 10mg  in PM. Also advised to take metoprolol XL 25mg  nightly. He is encouraged to follow a low sodium diet. He will f/u in four to six months.

## 2023-05-13 NOTE — Assessment & Plan Note (Signed)
Previous labs reviewed, platelets were slightly low.  I will recheck a CBC today.

## 2023-05-13 NOTE — Progress Notes (Signed)
 I,[name], CMA,acting as a Neurosurgeon for [name], MD.,have documented all relevant documentation on the behalf of [name], MD,as directed by  [name], MD while in the presence of [name], MD.  Subjective:   Patient ID: Lawrence Harrell , male    DOB: Apr 13, 1945 , 78 y.o.   MRN: [id number]  Chief Complaint  Patient presents with   Annual Exam   Hypertension   Hypothyroidism    HPI  He is here today for a full physical examination. He has no specific concerns at this time. He reports compliance with meds. He denies having any headaches, chest pain and shortness of breath.   He completes prostate exams with Urology.    Hypertension This is a chronic problem. The current episode started more than 1 year ago. The problem has been gradually improving since onset. The problem is controlled. Pertinent negatives include no blurred vision, chest pain, palpitations or shortness of breath. Risk factors for coronary artery disease include dyslipidemia. Past treatments include angiotensin blockers. The current treatment provides moderate improvement. Hypertensive end-organ damage includes kidney disease.  Hyperlipidemia This is a chronic problem. The current episode started more than 1 year ago. He has no history of diabetes. Pertinent negatives include no chest pain or shortness of breath. Current antihyperlipidemic treatment includes statins. Risk factors for coronary artery disease include hypertension, male sex and dyslipidemia.     Past Medical History:  Diagnosis Date   BPH with obstruction/lower urinary tract symptoms    High cholesterol    HTN (hypertension)    Prediabetes    Renal cell cancer, right (HCC)      Family History  Problem Relation Age of Onset   Hypertension Mother    Hypertension Father    Thyroid disease Sister      Current Outpatient Medications:    amLODipine (NORVASC) 10 MG tablet, TAKE 1 TABLET BY MOUTH DAILY, Disp: 90  tablet, Rfl: 3   Cholecalciferol (VITAMIN D3) 2000 units TABS, Take by mouth., Disp: , Rfl:    methimazole (TAPAZOLE) 5 MG tablet, Take 1 tablet (5 mg total) by mouth daily., Disp: 90 tablet, Rfl: 1   metoprolol succinate (TOPROL-XL) 25 MG 24 hr tablet, TAKE 1 TABLET BY MOUTH DAILY, Disp: 90 tablet, Rfl: 3   pravastatin (PRAVACHOL) 40 MG tablet, TAKE 1 TABLET BY MOUTH DAILY, Disp: 100 tablet, Rfl: 2   valsartan-hydrochlorothiazide (DIOVAN-HCT) 160-25 MG tablet, TAKE 1 TABLET BY MOUTH DAILY, Disp: 90 tablet, Rfl: 3   No Known Allergies   Men's preventive visit. Patient Health Questionnaire (PHQ-2) is  Flowsheet Row Office Visit from 01/14/2023 in [facility]  PHQ-2 Total Score 0     . Patient is on a low sodium diet. Marital status: Married. Relevant history for alcohol use is:  Social History   Substance and Sexual Activity  Alcohol Use Never  . Relevant history for tobacco use is:  Social History   Tobacco Use  Smoking Status Never  Smokeless Tobacco Never  .   Review of Systems  Constitutional: Negative.   HENT: Negative.    Eyes: Negative.  Negative for blurred vision.  Respiratory: Negative.  Negative for shortness of breath.   Cardiovascular: Negative.  Negative for chest pain and palpitations.  Gastrointestinal: Negative.   Endocrine: Negative.   Genitourinary: Negative.   Musculoskeletal: Negative.   Skin: Negative.   Allergic/Immunologic: Negative.   Neurological: Negative.   Hematological: Negative.  Today's Vitals   05/13/23 0842 05/13/23 0850  BP: (!) 140/78 134/80  Pulse: 74   Temp: 97.9 F (36.6 C)   SpO2: 98%   Weight: 182 lb 6.4 oz (82.7 kg)   Height: 6' (1.829 m)    Body mass index is 24.74 kg/m.  Wt Readings from Last 3 Encounters:  05/13/23 182 lb 6.4 oz (82.7 kg)  01/14/23 178 lb 9.6 oz (81 kg)  12/03/22 180 lb 3.2 oz (81.7 kg)    Objective:  Physical Exam Vitals and nursing note reviewed.   Constitutional:      Appearance: Normal appearance.  HENT:     Head: Normocephalic and atraumatic.     Right Ear: Tympanic membrane, ear canal and external ear normal.     Left Ear: Tympanic membrane, ear canal and external ear normal.  Eyes:     Extraocular Movements: Extraocular movements intact.     Conjunctiva/sclera: Conjunctivae normal.     Pupils: Pupils are equal, round, and reactive to light.  Cardiovascular:     Rate and Rhythm: Normal rate and regular rhythm.     Pulses: Normal pulses.     Heart sounds: Normal heart sounds.  Pulmonary:     Effort: Pulmonary effort is normal.     Breath sounds: Normal breath sounds.  Chest:  Breasts:    Right: Normal. No swelling, bleeding, inverted nipple, mass or nipple discharge.     Left: Normal. No swelling, bleeding, inverted nipple, mass or nipple discharge.  Abdominal:     General: Abdomen is flat. Bowel sounds are normal.     Palpations: Abdomen is soft.     Comments: Round, soft tissue mass, well-circumscribed mass left lower rib cage, LUQ  Genitourinary:    Comments: Masked  Musculoskeletal:        General: Normal range of motion.     Cervical back: Normal range of motion and neck supple.  Skin:    General: Skin is warm.     Comments: 2.5x2cm soft tissue well circumscribed mass LUQ, nontender  Neurological:     General: No focal deficit present.     Mental Status: He is alert.  Psychiatric:        Mood and Affect: Mood normal.        Behavior: Behavior normal.         Assessment And Plan:    Encounter for annual health examination Assessment & Plan: A full exam was performed.  DRE deferred, per patient request.  He is advised to get 30-45 minutes of regular exercise, no less than four to five days per week. Both weight-bearing and aerobic exercises are recommended.  He is advised to follow a healthy diet with at least six fruits/veggies per day, decrease intake of red meat and other saturated fats and to increase  fish intake to twice weekly.  Meats/fish should not be fried -- baked, boiled or broiled is preferable. It is also important to cut back on your sugar intake.  Be sure to read labels - try to avoid anything with added sugar, high fructose corn syrup or other sweeteners.  If you must use a sweetener, you can try stevia or monkfruit.  It is also important to avoid artificially sweetened foods/beverages and diet drinks. Lastly, wear SPF 50 sunscreen on exposed skin and when in direct sunlight for an extended period of time.  Be sure to avoid fast food restaurants and aim for at least 60 ounces of water daily.  Hypertensive nephropathy Assessment & Plan: Chronic, fair control. Goal BP<130/80. EKG performed, NSR w/ occasional PAC, voltage criteria for LVH.  He will c/w valsartan/hct 160/25mg  in am and amlodipine 10mg  in PM. Also advised to take metoprolol XL 25mg  nightly. He is encouraged to follow a low sodium diet. He will f/u in four to six months.   Orders: -     CMP14+EGFR -     Lipid panel -     CBC -     POCT urinalysis dipstick -     Microalbumin / creatinine urine ratio -     EKG 12-Lead  Chronic renal disease, stage II Assessment & Plan: Chronic, reminded to avoid NSAIDS, stay well hydrated and keep BP well controlled to decrease risk of CKD progression.   Orders: -     CMP14+EGFR  Hyperthyroidism Assessment & Plan: Chronic, recent Endo notes reviewed. He endorses compliance with methimazole 5mg  daily. He denies having any palpitations, diarrhea and dizziness.    Mass of soft tissue of abdomen Assessment & Plan: Radiographic study suggestive of lipoma.  He denies change in size, he is not having any pain.    Other abnormal glucose Assessment & Plan: HIS A1C HAS BEEN ELEVATED IN THE PAST. I WILL CHECK AN A1C, BMET TODAY. HE IS ENCOURAGED TO AVOID SUGARY BEVERAGES AND PROCESSED FOODS INCLUDNG BREADS, RICE AND PASTA.   Orders: -     Hemoglobin A1c  Thrombocytopenia  (HCC) Assessment & Plan: Previous labs reviewed, platelets were slightly low.  I will recheck a CBC today.   Orders: -     CBC     Return for 1 YEAR HM, 4 MONTH BPC. Patient was given opportunity to ask questions. Patient verbalized understanding of the plan and was able to repeat key elements of the plan. All questions were answered to their satisfaction.   I, [name], MD, have reviewed all documentation for this visit. The documentation on 05/13/23 for the exam, diagnosis, procedures, and orders are all accurate and complete.

## 2023-05-13 NOTE — Assessment & Plan Note (Signed)

## 2023-05-13 NOTE — Patient Instructions (Signed)
 Health Maintenance, Male  Adopting a healthy lifestyle and getting preventive care are important in promoting health and wellness. Ask your health care provider about:  The right schedule for you to have regular tests and exams.  Things you can do on your own to prevent diseases and keep yourself healthy.  What should I know about diet, weight, and exercise?  Eat a healthy diet    Eat a diet that includes plenty of vegetables, fruits, low-fat dairy products, and lean protein.  Do not eat a lot of foods that are high in solid fats, added sugars, or sodium.  Maintain a healthy weight  Body mass index (BMI) is a measurement that can be used to identify possible weight problems. It estimates body fat based on height and weight. Your health care provider can help determine your BMI and help you achieve or maintain a healthy weight.  Get regular exercise  Get regular exercise. This is one of the most important things you can do for your health. Most adults should:  Exercise for at least 150 minutes each week. The exercise should increase your heart rate and make you sweat (moderate-intensity exercise).  Do strengthening exercises at least twice a week. This is in addition to the moderate-intensity exercise.  Spend less time sitting. Even light physical activity can be beneficial.  Watch cholesterol and blood lipids  Have your blood tested for lipids and cholesterol at 79 years of age, then have this test every 5 years.  You may need to have your cholesterol levels checked more often if:  Your lipid or cholesterol levels are high.  You are older than 79 years of age.  You are at high risk for heart disease.  What should I know about cancer screening?  Many types of cancers can be detected early and may often be prevented. Depending on your health history and family history, you may need to have cancer screening at various ages. This may include screening for:  Colorectal cancer.  Prostate cancer.  Skin cancer.  Lung  cancer.  What should I know about heart disease, diabetes, and high blood pressure?  Blood pressure and heart disease  High blood pressure causes heart disease and increases the risk of stroke. This is more likely to develop in people who have high blood pressure readings or are overweight.  Talk with your health care provider about your target blood pressure readings.  Have your blood pressure checked:  Every 3-5 years if you are 9-95 years of age.  Every year if you are 79 years old or older.  If you are between the ages of 29 and 29 and are a current or former smoker, ask your health care provider if you should have a one-time screening for abdominal aortic aneurysm (AAA).  Diabetes  Have regular diabetes screenings. This checks your fasting blood sugar level. Have the screening done:  Once every three years after age 23 if you are at a normal weight and have a low risk for diabetes.  More often and at a younger age if you are overweight or have a high risk for diabetes.  What should I know about preventing infection?  Hepatitis B  If you have a higher risk for hepatitis B, you should be screened for this virus. Talk with your health care provider to find out if you are at risk for hepatitis B infection.  Hepatitis C  Blood testing is recommended for:  Everyone born from [birth year] through 1966.  Anyone  with known risk factors for hepatitis C.  Sexually transmitted infections (STIs)  You should be screened each year for STIs, including gonorrhea and chlamydia, if:  You are sexually active and are younger than 79 years of age.  You are older than 79 years of age and your health care provider tells you that you are at risk for this type of infection.  Your sexual activity has changed since you were last screened, and you are at increased risk for chlamydia or gonorrhea. Ask your health care provider if you are at risk.  Ask your health care provider about whether you are at high risk for HIV. Your health care provider  may recommend a prescription medicine to help prevent HIV infection. If you choose to take medicine to prevent HIV, you should first get tested for HIV. You should then be tested every 3 months for as long as you are taking the medicine.  Follow these instructions at home:  Alcohol use  Do not drink alcohol if your health care provider tells you not to drink.  If you drink alcohol:  Limit how much you have to 0-2 drinks a day.  Know how much alcohol is in your drink. In the [place] one drink equals one 12 oz bottle of beer (355 mL), one 5 oz glass of wine (148 mL), or one 1 oz glass of hard liquor (44 mL).  Lifestyle  Do not use any products that contain nicotine or tobacco. These products include cigarettes, chewing tobacco, and vaping devices, such as e-cigarettes. If you need help quitting, ask your health care provider.  Do not use street drugs.  Do not share needles.  Ask your health care provider for help if you need support or information about quitting drugs.  General instructions  Schedule regular health, dental, and eye exams.  Stay current with your vaccines.  Tell your health care provider if:  You often feel depressed.  You have ever been abused or do not feel safe at home.  Summary  Adopting a healthy lifestyle and getting preventive care are important in promoting health and wellness.  Follow your health care provider's instructions about healthy diet, exercising, and getting tested or screened for diseases.  Follow your health care provider's instructions on monitoring your cholesterol and blood pressure.  This information is not intended to replace advice given to you by your health care provider. Make sure you discuss any questions you have with your health care provider.  Document Revised: 07/17/2020 Document Reviewed: 07/17/2020  Elsevier Patient Education  2024 [employer].

## 2023-05-13 NOTE — Assessment & Plan Note (Signed)
Chronic, reminded to avoid NSAIDS, stay well hydrated and keep BP well controlled to decrease risk of CKD progression.

## 2023-05-14 LAB — LIPID PANEL
Chol/HDL Ratio: 2.9 ratio (ref 0.0–5.0)
Cholesterol, Total: 186 mg/dL (ref 100–199)
HDL: 65 mg/dL (ref >39)
LDL Chol Calc (NIH): 112 mg/dL — ABNORMAL HIGH (ref 0–99)
Triglycerides: 46 mg/dL (ref 0–149)
VLDL Cholesterol Cal: 9 mg/dL (ref 5–40)

## 2023-05-14 LAB — CMP14+EGFR
ALT: 12 IU/L (ref 0–44)
AST: 19 IU/L (ref 0–40)
Albumin: 4.3 g/dL (ref 3.8–4.8)
Alkaline Phosphatase: 104 IU/L (ref 44–121)
BUN/Creatinine Ratio: 16 (ref 10–24)
BUN: 19 mg/dL (ref 8–27)
Bilirubin Total: 0.6 mg/dL (ref 0.0–1.2)
CO2: 27 mmol/L (ref 20–29)
Calcium: 9.9 mg/dL (ref 8.6–10.2)
Chloride: 98 mmol/L (ref 96–106)
Creatinine, Ser: 1.2 mg/dL (ref 0.76–1.27)
Globulin, Total: 3.2 g/dL (ref 1.5–4.5)
Glucose: 80 mg/dL (ref 70–99)
Potassium: 3.4 mmol/L — ABNORMAL LOW (ref 3.5–5.2)
Sodium: 141 mmol/L (ref 134–144)
Total Protein: 7.5 g/dL (ref 6.0–8.5)
eGFR: 62 mL/min/{1.73_m2} (ref >59)

## 2023-05-14 LAB — CBC
Hematocrit: 46.1 % (ref 37.5–51.0)
Hemoglobin: 14.6 g/dL (ref 13.0–17.7)
MCH: 27.1 pg (ref 26.6–33.0)
MCHC: 31.7 g/dL (ref 31.5–35.7)
MCV: 86 fL (ref 79–97)
Platelets: 150 10*3/uL (ref 150–450)
RBC: 5.38 x10E6/uL (ref 4.14–5.80)
RDW: 14.6 % (ref 11.6–15.4)
WBC: 4.8 10*3/uL (ref 3.4–10.8)

## 2023-05-14 LAB — MICROALBUMIN / CREATININE URINE RATIO
Creatinine, Urine: 142 mg/dL
Microalb/Creat Ratio: 10 mg/g{creat} (ref 0–29)
Microalbumin, Urine: 14.6 ug/mL

## 2023-05-14 LAB — HEMOGLOBIN A1C
Est. average glucose Bld gHb Est-mCnc: 128 mg/dL
Hgb A1c MFr Bld: 6.1 % — ABNORMAL HIGH (ref 4.8–5.6)

## 2023-05-24 NOTE — Assessment & Plan Note (Signed)
 Chronic, recent Endo notes reviewed. He endorses compliance with methimazole 5mg  daily. He denies having any palpitations, diarrhea and dizziness.

## 2023-05-24 NOTE — Assessment & Plan Note (Signed)
 Radiographic study suggestive of lipoma.  He denies change in size, he is not having any pain.

## 2023-05-28 ENCOUNTER — Ambulatory Visit: Payer: Medicare Other

## 2023-05-28 DIAGNOSIS — Z0000 Encounter for general adult medical examination without abnormal findings: Secondary | ICD-10-CM

## 2023-05-28 NOTE — Patient Instructions (Signed)
 [name] , Thank you for taking time to come for your Medicare Wellness Visit. I appreciate your ongoing commitment to your health goals. Please review the following plan we discussed and let me know if I can assist you in the future.   Referrals/Orders/Follow-Ups/Clinician Recommendations: none  This is a list of the screening recommended for you and due dates:  Health Maintenance  Topic Date Due   COVID-19 Vaccine (6 - 2024-25 season) 11/10/2022   Flu Shot  06/09/2023*   Medicare Annual Wellness Visit  05/27/2024   Pneumonia Vaccine  Completed   Hepatitis C Screening  Completed   Zoster (Shingles) Vaccine  Completed   HPV Vaccine  Aged Out   DTaP/Tdap/Td vaccine  Discontinued   Colon Cancer Screening  Discontinued  *Topic was postponed. The date shown is not the original due date.    Advanced directives: (Copy Requested) Please bring a copy of your health care power of attorney and living will to the office to be added to your chart at your convenience. You can mail to [facility] [address] [place], [place] [address] or email to [email or web address]  Next Medicare Annual Wellness Visit scheduled for next year: No, office will schedule  insert Preventive Care attachment Insert FALL PREVENTION attachment if needed

## 2023-05-28 NOTE — Progress Notes (Signed)
 Subjective:   Raef Wyszynski is a 78 y.o. who presents for a Medicare Wellness preventive visit.  Visit Complete: Virtual I connected with  Welby Gingrich on 05/28/23 by a audio enabled telemedicine application and verified that I am speaking with the correct person using two identifiers.  Patient Location: Home  Provider Location: Office/Clinic  I discussed the limitations of evaluation and management by telemedicine. The patient expressed understanding and agreed to proceed.  Vital Signs: Because this visit was a virtual/telehealth visit, some criteria may be missing or patient reported. Any vitals not documented were not able to be obtained and vitals that have been documented are patient reported.  VideoError- Librarian, academic were attempted between this provider and patient, however failed, due to patient having technical difficulties OR patient did not have access to video capability.  We continued and completed visit with audio only.   Persons Participating in Visit: Patient.  AWV Questionnaire: No: Patient Medicare AWV questionnaire was not completed prior to this visit.  Cardiac Risk Factors include: advanced age (>84men, >10 women);hypertension;male gender     Objective:    Today's Vitals   There is no height or weight on file to calculate BMI.     05/28/2023   10:02 AM 05/29/2022    8:58 AM 05/09/2021    9:02 AM 04/27/2020    8:50 AM 04/22/2019    8:51 AM  Advanced Directives  Does Patient Have a Medical Advance Directive? Yes Yes Yes No Yes  Type of Estate agent of [place];Living will Healthcare Power of [place];Living will Healthcare Power of [place];Living will  Healthcare Power of [place];Living will  Copy of Healthcare Power of Attorney in Chart? No - copy requested No - copy requested No - copy requested  No - copy requested    Current Medications (verified) Outpatient Encounter Medications as of  05/28/2023  Medication Sig   amLODipine (NORVASC) 10 MG tablet TAKE 1 TABLET BY MOUTH DAILY   Cholecalciferol (VITAMIN D3) 2000 units TABS Take by mouth.   methimazole (TAPAZOLE) 5 MG tablet Take 1 tablet (5 mg total) by mouth daily.   metoprolol succinate (TOPROL-XL) 25 MG 24 hr tablet TAKE 1 TABLET BY MOUTH DAILY   pravastatin (PRAVACHOL) 40 MG tablet TAKE 1 TABLET BY MOUTH DAILY   valsartan-hydrochlorothiazide (DIOVAN-HCT) 160-25 MG tablet TAKE 1 TABLET BY MOUTH DAILY   No facility-administered encounter medications on file as of 05/28/2023.    Allergies (verified) Patient has no known allergies.   History: Past Medical History:  Diagnosis Date   BPH with obstruction/lower urinary tract symptoms    High cholesterol    HTN (hypertension)    Prediabetes    Renal cell cancer, right [facility])    Past Surgical History:  Procedure Laterality Date   gland extraction  09/2005   Salivary    nephrectomy  10/06/2013   partial    Family History  Problem Relation Age of Onset   Hypertension Mother    Hypertension Father    Thyroid disease Sister    Social History   Socioeconomic History   Marital status: Married    Spouse name: Not on file   Number of children: Not on file   Years of education: Not on file   Highest education level: Not on file  Occupational History   Not on file  Tobacco Use   Smoking status: Never   Smokeless tobacco: Never  Vaping Use   Vaping status: Never Used  Substance  and Sexual Activity   Alcohol use: Never   Drug use: Never   Sexual activity: Yes  Other Topics Concern   Not on file  Social History Narrative   Not on file   Social Drivers of Health   Financial Resource Strain: Low Risk  (05/28/2023)   Overall Financial Resource Strain (CARDIA)    Difficulty of Paying Living Expenses: Not hard at all  Food Insecurity: No Food Insecurity (05/28/2023)   Hunger Vital Sign    Worried About Running Out of Food in the Last Year: Never true    Ran  Out of Food in the Last Year: Never true  Transportation Needs: No Transportation Needs (05/28/2023)   PRAPARE - Administrator, Civil Service (Medical): No    Lack of Transportation (Non-Medical): No  Physical Activity: Sufficiently Active (05/28/2023)   Exercise Vital Sign    Days of Exercise per Week: 7 days    Minutes of Exercise per Session: 30 min  Stress: No Stress Concern Present (05/28/2023)   [name] of Occupational Health - Occupational Stress Questionnaire    Feeling of Stress : Not at all  Social Connections: Socially Integrated (05/28/2023)   Social Connection and Isolation Panel [NHANES]    Frequency of Communication with Friends and Family: Once a week    Frequency of Social Gatherings with Friends and Family: More than three times a week    Attends Religious Services: More than 4 times per year    Active Member of [employer] or Organizations: Not on file    Attends Engineer, structural: More than 4 times per year    Marital Status: Married    Tobacco Counseling Counseling given: Not Answered    Clinical Intake:  Pre-visit preparation completed: Yes  Pain : No/denies pain     Nutritional Risks: None Diabetes: No  Lab Results  Component Value Date   HGBA1C 6.1 (H) 05/13/2023   HGBA1C 6.0 (H) 01/14/2023   HGBA1C 5.8 (H) 09/05/2022     How often do you need to have someone help you when you read instructions, pamphlets, or other written materials from your doctor or pharmacy?: 1 - Never  Interpreter Needed?: No  Information entered by :: NAllen LPN   Activities of Daily Living     05/28/2023    9:59 AM 05/29/2022    8:58 AM  In your present state of health, do you have any difficulty performing the following activities:  Hearing? 0 0  Vision? 0 0  Difficulty concentrating or making decisions? 0 0  Walking or climbing stairs? 0 0  Dressing or bathing? 0 0  Doing errands, shopping? 0 0  Preparing Food and eating ? N N   Using the Toilet? N N  In the past six months, have you accidently leaked urine? N N  Do you have problems with loss of bowel control? N N  Managing your Medications? N N  Managing your Finances? N N  Housekeeping or managing your Housekeeping? N N    Patient Care Team: Dorothyann Peng, MD as PCP - General (Internal Medicine)  Indicate any recent Medical Services you may have received from other than [facility] providers in the past year (date may be approximate).     Assessment:   This is a routine wellness examination for Sch[place].  Hearing/Vision screen Hearing Screening - Comments:: Denies hearing issues Vision Screening - Comments:: Regular eye exams,    Goals Addressed  This Visit's Progress    Patient Stated       05/28/2023, maintain and stay active       Depression Screen     05/28/2023   10:03 AM 01/14/2023    8:27 AM 05/29/2022    8:58 AM 04/25/2022    8:29 AM 01/22/2022    8:26 AM 05/09/2021    9:02 AM 04/29/2021    3:33 PM  PHQ 2/9 Scores  PHQ - 2 Score 0 0 0 0 0 0 0  PHQ- 9 Score 0 0         Fall Risk     05/28/2023   10:03 AM 01/14/2023    8:27 AM 05/29/2022    8:58 AM 04/25/2022    8:29 AM 01/22/2022    8:26 AM  Fall Risk   Falls in the past year? 0 0 0 0 0  Number falls in past yr: 0 0 0 0 0  Injury with Fall? 0 0 0 0 0  Risk for fall due to : Medication side effect No Fall Risks Medication side effect No Fall Risks No Fall Risks  Follow up Falls prevention discussed;Falls evaluation completed Falls evaluation completed Falls prevention discussed;Education provided;Falls evaluation completed Falls evaluation completed Falls evaluation completed    MEDICARE RISK AT HOME:  Medicare Risk at Home Any stairs in or around the home?: Yes If so, are there any without handrails?: No Home free of loose throw rugs in walkways, pet beds, electrical cords, etc?: Yes Adequate lighting in your home to reduce risk of falls?: Yes Life alert?: No Use of  a cane, walker or w/c?: No Grab bars in the bathroom?: Yes Shower chair or bench in shower?: No Elevated toilet seat or a handicapped toilet?: Yes  TIMED UP AND GO:  Was the test performed?  No  Cognitive Function: 6CIT completed        05/28/2023   10:04 AM 05/29/2022    8:59 AM 05/09/2021    9:03 AM 04/27/2020    8:51 AM 04/22/2019    8:53 AM  6CIT Screen  What Year? 0 points 0 points 0 points 0 points 0 points  What month? 0 points 0 points 0 points 0 points 0 points  What time? 0 points 0 points 0 points 0 points 0 points  Count back from 20 0 points 0 points 0 points 0 points 0 points  Months in reverse 0 points 0 points 0 points 0 points 0 points  Repeat phrase 2 points 2 points 6 points 6 points 2 points  Total Score 2 points 2 points 6 points 6 points 2 points    Immunizations Immunization History  Administered Date(s) Administered   PFIZER(Purple Top)SARS-COV-2 Vaccination 06/03/2019, 06/29/2019, 12/28/2019, 01/07/2022   PNEUMOCOCCAL CONJUGATE-20 04/24/2021   Pfizer Covid-19 Vaccine Bivalent Booster 37yrs & up 01/24/2021   Pneumococcal Polysaccharide-23 11/21/2011   Zoster Recombinant(Shingrix) 04/10/2021, 08/27/2021   Zoster, Live 03/11/2012    Screening Tests Health Maintenance  Topic Date Due   COVID-19 Vaccine (6 - 2024-25 season) 11/10/2022   INFLUENZA VACCINE  06/09/2023 (Originally 10/10/2022)   Medicare Annual Wellness (AWV)  05/27/2024   Pneumonia Vaccine 57+ Years old  Completed   Hepatitis C Screening  Completed   Zoster Vaccines- Shingrix  Completed   HPV VACCINES  Aged Out   DTaP/Tdap/Td  Discontinued   Colonoscopy  Discontinued    Health Maintenance  Health Maintenance Due  Topic Date Due   COVID-19 Vaccine (6 - 2024-25  season) 11/10/2022   Health Maintenance Items Addressed: Declines covid  Additional Screening:  Vision Screening: Recommended annual ophthalmology exams for early detection of glaucoma and other disorders of the  eye.  Dental Screening: Recommended annual dental exams for proper oral hygiene  Community Resource Referral / Chronic Care Management: CRR required this visit?  No   CCM required this visit?  No     Plan:     I have personally reviewed and noted the following in the patient's chart:   Medical and social history Use of alcohol, tobacco or illicit drugs  Current medications and supplements including opioid prescriptions. Patient is not currently taking opioid prescriptions. Functional ability and status Nutritional status Physical activity Advanced directives List of other physicians Hospitalizations, surgeries, and ER visits in previous 12 months Vitals Screenings to include cognitive, depression, and falls Referrals and appointments  In addition, I have reviewed and discussed with patient certain preventive protocols, quality metrics, and best practice recommendations. A written personalized care plan for preventive services as well as general preventive health recommendations were provided to patient.     Barb Merino, LPN   06/17/8117   After Visit Summary: (Pick Up) Due to this being a telephonic visit, with patients personalized plan was offered to patient and patient has requested to Pick up at office.  Notes: Nothing significant to report at this time.

## 2023-06-03 ENCOUNTER — Encounter: Payer: Self-pay | Admitting: Internal Medicine

## 2023-06-03 ENCOUNTER — Ambulatory Visit: Payer: Medicare Other | Admitting: Internal Medicine

## 2023-06-03 VITALS — BP 128/68 | HR 80 | Ht 72.0 in | Wt 180.4 lb

## 2023-06-03 DIAGNOSIS — E0500 Thyrotoxicosis with diffuse goiter without thyrotoxic crisis or storm: Secondary | ICD-10-CM

## 2023-06-03 NOTE — Patient Instructions (Signed)
For now, continue: - Methimazole 5 mg daily - Toprol XL 25 mg daily  Please stop at the lab.  Please return in 6 months, but possibly sooner for labs.

## 2023-06-03 NOTE — Progress Notes (Unsigned)
 Patient ID: Lawrence Harrell, male   DOB: 26-Feb-1946, 78 y.o.   MRN: [id number]  HPI  Lawrence Harrell is a 78 y.o.-year-old male, returning for follow-up for thyrotoxicosis, diagnosed in 2022, with presumed diagnosis of Graves' disease.  He was previously seen by Dr. Everardo All.  Last visit with me 6 months ago.  Interim hx.: At today's visit, he has no complaints other than some eye irritation 2/2 allergies.  No tremors, palpitations, heat intolerance, anxiety, insomnia, weight loss.  Reviewed history: He was diagnosed with thyrotoxicosis in 12/2020.   Before dx., he lost weight - 30 lbs (from 170-173 lbs) in 2 months.  At last visit he was on: - Methimazole 10 mg daily (dose reduced from 10 mg 2x in 05/2021) - Toprol XL 25 mg daily At that time, we stopped methimazole due to persistently elevated TSH.  In 08/2021, we had to restart methimazole at 5 mg daily.  In 01/2022, he decreased the dose to 5 mg every other day by himself.  In 05/2022, we increased the dose back to 5 mg daily.  I reviewed pt's thyroid tests: Lab Results  Component Value Date   TSH 2.67 12/03/2022   TSH 0.39 07/15/2022   TSH 0.02 (L) 05/28/2022   TSH 3.49 11/22/2021   TSH <0.01 Repeated and verified X2. (L) 09/06/2021   TSH 10.13 (H) 07/23/2021   TSH 8.09 (H) 05/15/2021   TSH 0.01 (L) 04/02/2021   TSH <0.01 02/13/2021   TSH <0.005 (L) 12/19/2020   FREET4 0.73 12/03/2022   FREET4 0.70 07/15/2022   FREET4 1.12 05/28/2022   FREET4 0.65 11/22/2021   FREET4 2.00 (H) 09/06/2021   FREET4 0.62 07/23/2021   FREET4 0.40 (L) 05/15/2021   FREET4 0.64 04/02/2021   FREET4 1.94 (H) 02/13/2021   FREET4 3.28 (H) 12/19/2020   T3FREE 3.6 12/03/2022   T3FREE 3.5 07/15/2022   T3FREE 3.8 05/28/2022   T3FREE 3.5 11/22/2021   T3FREE 5.8 (H) 09/06/2021   T3FREE 3.3 07/23/2021   T3FREE 10.2 (H) 12/19/2020   Antithyroid antibodies: Lab Results  Component Value Date   TSI 406 (H) 12/03/2022   TSI 500 (H) 07/23/2021    Pt denies: - feeling nodules in neck - hoarseness - dysphagia - choking  Pt does have a FH of thyroid ds.in sister, niece. No FH of thyroid cancer. No h/o radiation tx to head or neck. No recent contrast studies. No steroid use. No herbal supplements. No Biotin use.  Pt. also has a history of prediabetes, HTN, HL, history of renal cell carcinoma, lipoma.  ROS: Constitutional: + see HPI  Past Medical History:  Diagnosis Date   BPH with obstruction/lower urinary tract symptoms    High cholesterol    HTN (hypertension)    Prediabetes    Renal cell cancer, right [facility])    Past Surgical History:  Procedure Laterality Date   gland extraction  09/2005   Salivary    nephrectomy  10/06/2013   partial    Social History   Socioeconomic History   Marital status: Married    Spouse name: Not on file   Number of children: Not on file   Years of education: Not on file   Highest education level: Not on file  Occupational History   Not on file  Tobacco Use   Smoking status: Never   Smokeless tobacco: Never  Vaping Use   Vaping status: Never Used  Substance and Sexual Activity   Alcohol use: Never   Drug use: Never  Sexual activity: Yes  Other Topics Concern   Not on file  Social History Narrative   Not on file   Social Drivers of Health   Financial Resource Strain: Low Risk  (05/28/2023)   Overall Financial Resource Strain (CARDIA)    Difficulty of Paying Living Expenses: Not hard at all  Food Insecurity: No Food Insecurity (05/28/2023)   Hunger Vital Sign    Worried About Running Out of Food in the Last Year: Never true    Ran Out of Food in the Last Year: Never true  Transportation Needs: No Transportation Needs (05/28/2023)   PRAPARE - Administrator, Civil Service (Medical): No    Lack of Transportation (Non-Medical): No  Physical Activity: Sufficiently Active (05/28/2023)   Exercise Vital Sign    Days of Exercise per Week: 7 days    Minutes of  Exercise per Session: 30 min  Stress: No Stress Concern Present (05/28/2023)   [name] of Occupational Health - Occupational Stress Questionnaire    Feeling of Stress : Not at all  Social Connections: Socially Integrated (05/28/2023)   Social Connection and Isolation Panel [NHANES]    Frequency of Communication with Friends and Family: Once a week    Frequency of Social Gatherings with Friends and Family: More than three times a week    Attends Religious Services: More than 4 times per year    Active Member of [employer] or Organizations: Not on file    Attends Banker Meetings: More than 4 times per year    Marital Status: Married  Catering manager Violence: Not At Risk (05/28/2023)   Humiliation, Afraid, Rape, and Kick questionnaire    Fear of Current or Ex-Partner: No    Emotionally Abused: No    Physically Abused: No    Sexually Abused: No   Current Outpatient Medications on File Prior to Visit  Medication Sig Dispense Refill   amLODipine (NORVASC) 10 MG tablet TAKE 1 TABLET BY MOUTH DAILY 90 tablet 3   Cholecalciferol (VITAMIN D3) 2000 units TABS Take by mouth.     methimazole (TAPAZOLE) 5 MG tablet Take 1 tablet (5 mg total) by mouth daily. 90 tablet 1   metoprolol succinate (TOPROL-XL) 25 MG 24 hr tablet TAKE 1 TABLET BY MOUTH DAILY 90 tablet 3   pravastatin (PRAVACHOL) 40 MG tablet TAKE 1 TABLET BY MOUTH DAILY 100 tablet 2   valsartan-hydrochlorothiazide (DIOVAN-HCT) 160-25 MG tablet TAKE 1 TABLET BY MOUTH DAILY 90 tablet 3   No current facility-administered medications on file prior to visit.   No Known Allergies Family History  Problem Relation Age of Onset   Hypertension Mother    Hypertension Father    Thyroid disease Sister    PE: BP 128/68   Pulse 80   Ht 6' (1.829 m)   Wt 180 lb 6.4 oz (81.8 kg)   SpO2 96%   BMI 24.47 kg/m  Wt Readings from Last 10 Encounters:  06/03/23 180 lb 6.4 oz (81.8 kg)  05/13/23 182 lb 6.4 oz (82.7 kg)  01/14/23  178 lb 9.6 oz (81 kg)  12/03/22 180 lb 3.2 oz (81.7 kg)  09/05/22 181 lb (82.1 kg)  05/29/22 178 lb (80.7 kg)  05/28/22 178 lb 3.2 oz (80.8 kg)  04/25/22 177 lb 6.4 oz (80.5 kg)  01/22/22 175 lb 6.4 oz (79.6 kg)  11/22/21 174 lb (78.9 kg)   Constitutional: Slightly overweight, in NAD Eyes: no exophthalmos, no lid lag, no stare ENT: no  thyromegaly, no cervical lymphadenopathy Cardiovascular: RRR, No MRG Respiratory: CTA B Musculoskeletal: no deformities Skin: no rashes Neurological: no tremor with outstretched hands  ASSESSMENT: 1. Graves ds.  PLAN:  1. Patient with history of low TSH with high free thyroid hormones, with thyrotoxic symptoms including weight loss, heat intolerance, palpitations, hyperdefecation, anxiety, all resolved after starting methimazole. -he was previously followed by Dr. Everardo All.  When I first saw him, the TSH was elevated, at 10.  TSI antibodies returned elevated, confirming Graves' disease.  I advised him to stop methimazole and return for labs.  The TSH became suppressed again so we had to start back methimazole but at a lower dose, 5 mg daily.  However, he subsequently decreased the dose of methimazole to 5 mg every other day (?  why).  A TSH was suppressed on this dose in 05/2022 so we increased the dose back to 5 mg daily.  At last visit, his TFTs were all normal on this dose.  His TSI's were still elevated, but improving, at 406. -At today's visit, he denies thyrotoxic signs and symptoms like weight loss, tremors, palpitations, heat intolerance. -He tolerates methimazole well, so for now, we will continue this -We will check his TFTs today and adjust the methimazole dose accordingly -She is aware about the possible use of RAI ablation or thyroidectomy but we did not have to use these yet -He has no active signs of Graves' ophthalmopathy: No double vision, blurry vision, eye pain. He has a little eye irritation (itching) 2/2 allergies. -He continues on  Toprol-XL 25 mg daily.  No tachycardia today. -I will see him back in 6 months or possibly sooner for labs Patient Instructions  For now, continue: - Methimazole 5 mg every day - Toprol XL 25 mg daily  Please stop at the lab.  Please return in 6 months, but possibly sooner for labs.  Needs refills - [facility].  Orders Placed This Encounter  Procedures   TSH   T4, free   T3, free   Carlus Pavlov, MD PhD [facility]

## 2023-06-04 LAB — TSH: TSH: 3.84 m[IU]/L (ref 0.40–4.50)

## 2023-06-04 LAB — T3, FREE: T3, Free: 3.4 pg/mL (ref 2.3–4.2)

## 2023-06-04 LAB — T4, FREE: Free T4: 1.1 ng/dL (ref 0.8–1.8)

## 2023-06-04 MED ORDER — METHIMAZOLE 5 MG PO TABS: 5.0000 mg | ORAL_TABLET | Freq: Every day | ORAL | Status: DC

## 2023-08-03 ENCOUNTER — Other Ambulatory Visit: Payer: Self-pay | Admitting: Internal Medicine

## 2023-08-11 ENCOUNTER — Other Ambulatory Visit: Payer: Self-pay

## 2023-08-11 MED ORDER — METHIMAZOLE 5 MG PO TABS: 5.0000 mg | ORAL_TABLET | Freq: Every day | ORAL | 3 refills | Status: DC

## 2023-09-12 ENCOUNTER — Other Ambulatory Visit: Payer: Self-pay | Admitting: Internal Medicine

## 2023-09-12 DIAGNOSIS — E0590 Thyrotoxicosis, unspecified without thyrotoxic crisis or storm: Secondary | ICD-10-CM

## 2023-09-15 NOTE — Telephone Encounter (Signed)
 Refill request complete

## 2023-10-07 ENCOUNTER — Ambulatory Visit: Admitting: Internal Medicine

## 2023-10-21 NOTE — Patient Instructions (Signed)
 Hypertension, Adult Hypertension is another name for high blood pressure. High blood pressure forces your heart to work harder to pump blood. This can cause problems over time. There are two numbers in a blood pressure reading. There is a top number (systolic) over a bottom number (diastolic). It is best to have a blood pressure that is below 120/80. What are the causes? The cause of this condition is not known. Some other conditions can lead to high blood pressure. What increases the risk? Some lifestyle factors can make you more likely to develop high blood pressure: Smoking. Not getting enough exercise or physical activity. Being overweight. Having too much fat, sugar, calories, or salt (sodium) in your diet. Drinking too much alcohol . Other risk factors include: Having any of these conditions: Heart disease. Diabetes. High cholesterol. Kidney disease. Obstructive sleep apnea. Having a family history of high blood pressure and high cholesterol. Age. The risk increases with age. Stress. What are the signs or symptoms? High blood pressure may not cause symptoms. Very high blood pressure (hypertensive crisis) may cause: Headache. Fast or uneven heartbeats (palpitations). Shortness of breath. Nosebleed. Vomiting or feeling like you may vomit (nauseous). Changes in how you see. Very bad chest pain. Feeling dizzy. Seizures. How is this treated? This condition is treated by making healthy lifestyle changes, such as: Eating healthy foods. Exercising more. Drinking less alcohol . Your doctor may prescribe medicine if lifestyle changes do not help enough and if: Your top number is above 130. Your bottom number is above 80. Your personal target blood pressure may vary. Follow these instructions at home: Eating and drinking  If told, follow the DASH eating plan. To follow this plan: Fill one half of your plate at each meal with fruits and vegetables. Fill one fourth of your plate  at each meal with whole grains. Whole grains include whole-wheat pasta, brown rice, and whole-grain bread. Eat or drink low-fat dairy products, such as skim milk or low-fat yogurt. Fill one fourth of your plate at each meal with low-fat (lean) proteins. Low-fat proteins include fish, chicken without skin, eggs, beans, and tofu. Avoid fatty meat, cured and processed meat, or chicken with skin. Avoid pre-made or processed food. Limit the amount of salt in your diet to less than 1,500 mg each day. Do not drink alcohol  if: Your doctor tells you not to drink. You are pregnant, may be pregnant, or are planning to become pregnant. If you drink alcohol : Limit how much you have to: 0-1 drink a day for women. 0-2 drinks a day for men. Know how much alcohol  is in your drink. In the [place] one drink equals one 12 oz bottle of beer (355 mL), one 5 oz glass of wine (148 mL), or one 1 oz glass of hard liquor (44 mL). Lifestyle  Work with your doctor to stay at a healthy weight or to lose weight. Ask your doctor what the best weight is for you. Get at least 30 minutes of exercise that causes your heart to beat faster (aerobic exercise) most days of the week. This may include walking, swimming, or biking. Get at least 30 minutes of exercise that strengthens your muscles (resistance exercise) at least 3 days a week. This may include lifting weights or doing Pilates. Do not smoke or use any products that contain nicotine  or tobacco. If you need help quitting, ask your doctor. Check your blood pressure at home as told by your doctor. Keep all follow-up visits. Medicines Take over-the-counter and prescription medicines  only as told by your doctor. Follow directions carefully. Do not skip doses of blood pressure medicine. The medicine does not work as well if you skip doses. Skipping doses also puts you at risk for problems. Ask your doctor about side effects or reactions to medicines that you should watch  for. Contact a doctor if: You think you are having a reaction to the medicine you are taking. You have headaches that keep coming back. You feel dizzy. You have swelling in your ankles. You have trouble with your vision. Get help right away if: You get a very bad headache. You start to feel mixed up (confused). You feel weak or numb. You feel faint. You have very bad pain in your: Chest. Belly (abdomen). You vomit more than once. You have trouble breathing. These symptoms may be an emergency. Get help right away. Call 911. Do not wait to see if the symptoms will go away. Do not drive yourself to the hospital. Summary Hypertension is another name for high blood pressure. High blood pressure forces your heart to work harder to pump blood. For most people, a normal blood pressure is less than 120/80. Making healthy choices can help lower blood pressure. If your blood pressure does not get lower with healthy choices, you may need to take medicine. This information is not intended to replace advice given to you by your health care provider. Make sure you discuss any questions you have with your health care provider. Document Revised: 12/14/2020 Document Reviewed: 12/14/2020 Elsevier Patient Education  2024 [employer].

## 2023-10-21 NOTE — Progress Notes (Signed)
 I,Lawrence Harrell, CMA,acting as a Neurosurgeon for Lawrence LOISE Slocumb, MD.,have documented all relevant documentation on the behalf of Lawrence LOISE Slocumb, MD,as directed by  Lawrence LOISE Slocumb, MD while in the presence of Lawrence LOISE Slocumb, MD.  Subjective:  Patient ID: Lawrence Harrell , male    DOB: [birth year] , 78 y.o.   MRN: [id number]  Chief Complaint  Patient presents with   Hypertension    Patient presents today for bpc & thyroid  follow up. He reports compliance with medications. Denies headache, chest pain & sob.    Hyperthyroidism    HPI Discussed the use of AI scribe software for clinical note transcription with the patient, who gave verbal consent to proceed.  History of Present Illness Lawrence Harrell is a 78 year old male with hypertension and hyperlipidemia who presents for a blood pressure check.  He is currently taking amlodipine , metoprolol , valsartan , and hydrochlorothiazide  for blood pressure management. His home blood pressure readings typically run at 120/73 mmHg. He remains physically active, engaging in activities such as walking and mowing his yard. No tingling in hands or feet, and balance is reported as good.  He is also taking pravastatin  for hyperlipidemia. His appetite is good, and he has gained three pounds since March.  He has a history of thyroid  issues and is under the care of a thyroid  specialist, taking methimazole . He is scheduled to see the thyroid  doctor in September.  He has been visiting the [place] for about a year for additional care, including seeing a hearing doctor and a psychiatrist[facility] and he wishes to continue to receive care from his current provider. He does not take any medication for heartburn and reports regular bowel movements.   Hypertension This is a chronic problem. The current episode started more than 1 year ago. The problem has been gradually improving since onset. The problem is controlled. Pertinent negatives  include no blurred vision, chest pain, palpitations or shortness of breath. Risk factors for coronary artery disease include dyslipidemia and male gender. The current treatment provides moderate improvement. Hypertensive end-organ damage includes kidney disease.     Past Medical History:  Diagnosis Date   BPH with obstruction/lower urinary tract symptoms    High cholesterol    HTN (hypertension)    Prediabetes    Renal cell cancer, right (HCC)      Family History  Problem Relation Age of Onset   Hypertension Mother    Hypertension Father    Thyroid  disease Sister      Current Outpatient Medications:    amLODipine  (NORVASC ) 10 MG tablet, TAKE 1 TABLET BY MOUTH DAILY, Disp: 100 tablet, Rfl: 2   Cholecalciferol (VITAMIN D3) 2000 units TABS, Take by mouth., Disp: , Rfl:    methimazole  (TAPAZOLE ) 5 MG tablet, Take 1 tablet (5 mg total) by mouth daily., Disp: 90 tablet, Rfl: 3   metoprolol  succinate (TOPROL -XL) 25 MG 24 hr tablet, TAKE 1 TABLET BY MOUTH DAILY, Disp: 100 tablet, Rfl: 2   pravastatin  (PRAVACHOL ) 40 MG tablet, TAKE 1 TABLET BY MOUTH DAILY, Disp: 100 tablet, Rfl: 2   valsartan -hydrochlorothiazide  (DIOVAN -HCT) 160-25 MG tablet, TAKE 1 TABLET BY MOUTH DAILY, Disp: 100 tablet, Rfl: 2   No Known Allergies   Review of Systems  Constitutional: Negative.   Eyes:  Negative for blurred vision.  Respiratory: Negative.  Negative for shortness of breath.   Cardiovascular: Negative.  Negative for chest pain and palpitations.  Endocrine: Negative.   Skin:  Negative.   Allergic/Immunologic: Negative.   Hematological: Negative.      Today's Vitals   10/22/23 0840  BP: 130/82  Pulse: 71  Temp: 97.8 F (36.6 C)  SpO2: 98%  Weight: 183 lb 6.4 oz (83.2 kg)  Height: 6' (1.829 m)   Body mass index is 24.87 kg/m.  Wt Readings from Last 3 Encounters:  10/22/23 183 lb 6.4 oz (83.2 kg)  06/03/23 180 lb 6.4 oz (81.8 kg)  05/13/23 182 lb 6.4 oz (82.7 kg)     Objective:  Physical  Exam Vitals and nursing note reviewed.  Constitutional:      Appearance: Normal appearance.  Cardiovascular:     Rate and Rhythm: Normal rate and regular rhythm.     Heart sounds: Normal heart sounds.  Pulmonary:     Effort: Pulmonary effort is normal.     Breath sounds: Normal breath sounds.  Abdominal:     Palpations: Abdomen is soft.  Musculoskeletal:     Cervical back: Normal range of motion.  Skin:    General: Skin is warm.  Neurological:     General: No focal deficit present.     Mental Status: He is alert.  Psychiatric:        Mood and Affect: Mood normal.         Assessment And Plan:  Hypertensive nephropathy Assessment & Plan: Chronic, fair control. Goal BP<130/80. He will c/w valsartan /hct 160/25mg  in am and amlodipine  10mg  in PM. Also advised to take metoprolol  XL 25mg  nightly. He is encouraged to follow a low sodium diet. He will f/u in four to six months.   Orders: -     CMP14+EGFR  Chronic renal disease, stage II Assessment & Plan: Chronic, reminded to avoid NSAIDS, stay well hydrated and keep BP well controlled to decrease risk of CKD progression.    Hyperthyroidism Assessment & Plan: Chronic, recent Endo notes reviewed. He endorses compliance with methimazole  5mg  daily. He denies having any palpitations, diarrhea and dizziness.    Prediabetes Assessment & Plan: Previous labs reviewed, his A1c has been elevated in the past. I will check an A1c today. Reminded to avoid refined sugars including sugary drinks/foods and processed meats including bacon, sausages and deli meats.    Orders: -     CMP14+EGFR -     Hemoglobin A1c    Return in 4 months (on 02/21/2024), or bp check, for move physical to April.  Patient was given opportunity to ask questions. Patient verbalized understanding of the plan and was able to repeat key elements of the plan. All questions were answered to their satisfaction.   I, Lawrence LOISE Slocumb, MD, have reviewed all  documentation for this visit. The documentation on 10/22/23 for the exam, diagnosis, procedures, and orders are all accurate and complete.   IF YOU HAVE BEEN REFERRED TO A SPECIALIST, IT MAY TAKE 1-2 WEEKS TO SCHEDULE/PROCESS THE REFERRAL. IF YOU HAVE NOT HEARD FROM US /SPECIALIST IN TWO WEEKS, PLEASE GIVE US  A CALL AT [phone].   THE PATIENT IS ENCOURAGED TO PRACTICE SOCIAL DISTANCING DUE TO THE COVID-19 PANDEMIC.

## 2023-10-22 ENCOUNTER — Encounter: Payer: Self-pay | Admitting: Internal Medicine

## 2023-10-22 ENCOUNTER — Ambulatory Visit: Admitting: Internal Medicine

## 2023-10-22 VITALS — BP 130/82 | HR 71 | Temp 97.8°F | Ht 72.0 in | Wt 183.4 lb

## 2023-10-22 DIAGNOSIS — R7309 Other abnormal glucose: Secondary | ICD-10-CM | POA: Diagnosis not present

## 2023-10-22 DIAGNOSIS — N182 Chronic kidney disease, stage 2 (mild): Secondary | ICD-10-CM

## 2023-10-22 DIAGNOSIS — I129 Hypertensive chronic kidney disease with stage 1 through stage 4 chronic kidney disease, or unspecified chronic kidney disease: Secondary | ICD-10-CM

## 2023-10-22 DIAGNOSIS — E0590 Thyrotoxicosis, unspecified without thyrotoxic crisis or storm: Secondary | ICD-10-CM

## 2023-10-22 DIAGNOSIS — R7303 Prediabetes: Secondary | ICD-10-CM | POA: Diagnosis not present

## 2023-10-22 DIAGNOSIS — D696 Thrombocytopenia, unspecified: Secondary | ICD-10-CM

## 2023-10-22 LAB — CMP14+EGFR
ALT: 13 IU/L (ref 0–44)
AST: 18 IU/L (ref 0–40)
Albumin: 4.4 g/dL (ref 3.8–4.8)
Alkaline Phosphatase: 97 IU/L (ref 44–121)
BUN/Creatinine Ratio: 16 (ref 10–24)
BUN: 20 mg/dL (ref 8–27)
Bilirubin Total: 0.5 mg/dL (ref 0.0–1.2)
CO2: 26 mmol/L (ref 20–29)
Calcium: 9.4 mg/dL (ref 8.6–10.2)
Chloride: 99 mmol/L (ref 96–106)
Creatinine, Ser: 1.25 mg/dL (ref 0.76–1.27)
Globulin, Total: 3.4 g/dL (ref 1.5–4.5)
Glucose: 89 mg/dL (ref 70–99)
Potassium: 3.5 mmol/L (ref 3.5–5.2)
Sodium: 143 mmol/L (ref 134–144)
Total Protein: 7.8 g/dL (ref 6.0–8.5)
eGFR: 59 mL/min/1.73 — ABNORMAL LOW (ref >59)

## 2023-10-22 LAB — HEMOGLOBIN A1C
Est. average glucose Bld gHb Est-mCnc: 128 mg/dL
Hgb A1c MFr Bld: 6.1 % — ABNORMAL HIGH (ref 4.8–5.6)

## 2023-10-24 ENCOUNTER — Ambulatory Visit: Payer: Self-pay | Admitting: Internal Medicine

## 2023-10-26 NOTE — Assessment & Plan Note (Signed)
 Chronic, fair control. Goal BP<130/80. He will c/w valsartan /hct 160/25mg  in am and amlodipine  10mg  in PM. Also advised to take metoprolol  XL 25mg  nightly. He is encouraged to follow a low sodium diet. He will f/u in four to six months.

## 2023-10-26 NOTE — Assessment & Plan Note (Signed)
Chronic, reminded to avoid NSAIDS, stay well hydrated and keep BP well controlled to decrease risk of CKD progression.

## 2023-10-26 NOTE — Assessment & Plan Note (Signed)
 Previous labs reviewed, his A1c has been elevated in the past. I will check an A1c today. Reminded to avoid refined sugars including sugary drinks/foods and processed meats including bacon, sausages and deli meats.

## 2023-10-26 NOTE — Assessment & Plan Note (Signed)
 Chronic, recent Endo notes reviewed. He endorses compliance with methimazole 5mg  daily. He denies having any palpitations, diarrhea and dizziness.

## 2023-12-09 ENCOUNTER — Ambulatory Visit: Admitting: Internal Medicine

## 2023-12-09 ENCOUNTER — Encounter: Payer: Self-pay | Admitting: Internal Medicine

## 2023-12-09 VITALS — BP 138/86 | HR 75 | Ht 72.0 in | Wt 182.6 lb

## 2023-12-09 DIAGNOSIS — E0500 Thyrotoxicosis with diffuse goiter without thyrotoxic crisis or storm: Secondary | ICD-10-CM | POA: Diagnosis not present

## 2023-12-09 LAB — T3, FREE: T3, Free: 3.1 pg/mL (ref 2.3–4.2)

## 2023-12-09 LAB — TSH: TSH: 6.98 m[IU]/L — ABNORMAL HIGH (ref 0.40–4.50)

## 2023-12-09 LAB — T4, FREE: Free T4: 1 ng/dL (ref 0.8–1.8)

## 2023-12-09 NOTE — Patient Instructions (Addendum)
 For now, continue: - Methimazole  5 mg daily - Toprol  XL 25 mg daily  Please stop at the lab.  Please return in 1 year but for labs in 6 months.

## 2023-12-09 NOTE — Progress Notes (Signed)
 Patient ID: Lawrence Harrell, male   DOB: 19-Mar-1945, 78 y.o.   MRN: [id number]  HPI  Lawrence Harrell is a 78 y.o.-year-old male,old male, returning for follow-up for thyrotoxicosis, diagnosed in 2022, with presumed diagnosis of Graves' disease.  He was previously seen by Dr. Kassie.  Last visit with me 6 months ago.  Interim hx.: No tremors, palpitations, heat intolerance, anxiety, insomnia, weight loss. He continues to complain of eyes itching 2/2 allergies.  He is seeing an ophthalmologist.  Reviewed history: He was diagnosed with thyrotoxicosis in 12/2020.   Before dx., he lost weight - 30 lbs (from 170-173 lbs) in 2 months.  When I first saw him, he was on: - Methimazole  10 mg daily (dose reduced from 10 mg 2x in 05/2021) - Toprol  XL 25 mg daily At that time, we stopped methimazole  due to persistently elevated TSH.  In 08/2021, we had to restart methimazole  at 5 mg daily.  In 01/2022, he decreased the dose to 5 mg every other day by himself.  In 05/2022, we increased the dose back to 5 mg daily.  I reviewed pt's thyroid  tests: Lab Results  Component Value Date   TSH 3.84 06/03/2023   TSH 2.67 12/03/2022   TSH 0.39 07/15/2022   TSH 0.02 (L) 05/28/2022   TSH 3.49 11/22/2021   TSH <0.01 Repeated and verified X2. (L) 09/06/2021   TSH 10.13 (H) 07/23/2021   TSH 8.09 (H) 05/15/2021   TSH 0.01 (L) 04/02/2021   TSH <0.01 02/13/2021   FREET4 1.1 06/03/2023   FREET4 0.73 12/03/2022   FREET4 0.70 07/15/2022   FREET4 1.12 05/28/2022   FREET4 0.65 11/22/2021   FREET4 2.00 (H) 09/06/2021   FREET4 0.62 07/23/2021   FREET4 0.40 (L) 05/15/2021   FREET4 0.64 04/02/2021   FREET4 1.94 (H) 02/13/2021   T3FREE 3.4 06/03/2023   T3FREE 3.6 12/03/2022   T3FREE 3.5 07/15/2022   T3FREE 3.8 05/28/2022   T3FREE 3.5 11/22/2021   T3FREE 5.8 (H) 09/06/2021   T3FREE 3.3 07/23/2021   T3FREE 10.2 (H) 12/19/2020   Antithyroid antibodies: Lab Results  Component Value Date   TSI 406 (H) 12/03/2022    TSI 500 (H) 07/23/2021   Pt denies: - feeling nodules in neck - hoarseness - dysphagia - choking  Pt does have a FH of thyroid  ds.in sister, niece. No FH of thyroid  cancer. No h/o radiation tx to head or neck. No steroid use. No herbal supplements. No Biotin use.  Pt. also has a history of prediabetes, HTN, HL, history of renal cell carcinoma, lipoma.  ROS: Constitutional: + see HPI  Past Medical History:  Diagnosis Date   BPH with obstruction/lower urinary tract symptoms    High cholesterol    HTN (hypertension)    Prediabetes    Renal cell cancer, right [facility])    Past Surgical History:  Procedure Laterality Date   gland extraction  09/2005   Salivary    nephrectomy  10/06/2013   partial    Social History   Socioeconomic History   Marital status: Married    Spouse name: Not on file   Number of children: Not on file   Years of education: Not on file   Highest education level: Not on file  Occupational History   Not on file  Tobacco Use   Smoking status: Never   Smokeless tobacco: Never  Vaping Use   Vaping status: Never Used  Substance and Sexual Activity   Alcohol use: Never   Drug use:  Never   Sexual activity: Yes  Other Topics Concern   Not on file  Social History Narrative   Not on file   Social Drivers of Health   Financial Resource Strain: Low Risk  (05/28/2023)   Overall Financial Resource Strain (CARDIA)    Difficulty of Paying Living Expenses: Not hard at all  Food Insecurity: No Food Insecurity (05/28/2023)   Hunger Vital Sign    Worried About Running Out of Food in the Last Year: Never true    Ran Out of Food in the Last Year: Never true  Transportation Needs: No Transportation Needs (05/28/2023)   PRAPARE - Administrator, Civil Service (Medical): No    Lack of Transportation (Non-Medical): No  Physical Activity: Sufficiently Active (05/28/2023)   Exercise Vital Sign    Days of Exercise per Week: 7 days    Minutes of Exercise  per Session: 30 min  Stress: No Stress Concern Present (05/28/2023)   [name] of Occupational Health - Occupational Stress Questionnaire    Feeling of Stress : Not at all  Social Connections: Socially Integrated (05/28/2023)   Social Connection and Isolation Panel    Frequency of Communication with Friends and Family: Once a week    Frequency of Social Gatherings with Friends and Family: More than three times a week    Attends Religious Services: More than 4 times per year    Active Member of [employer] or Organizations: Not on file    Attends Banker Meetings: More than 4 times per year    Marital Status: Married  Catering manager Violence: Not At Risk (05/28/2023)   Humiliation, Afraid, Rape, and Kick questionnaire    Fear of Current or Ex-Partner: No    Emotionally Abused: No    Physically Abused: No    Sexually Abused: No   Current Outpatient Medications on File Prior to Visit  Medication Sig Dispense Refill   amLODipine  (NORVASC ) 10 MG tablet TAKE 1 TABLET BY MOUTH DAILY 100 tablet 2   Cholecalciferol (VITAMIN D3) 2000 units TABS Take by mouth.     methimazole  (TAPAZOLE ) 5 MG tablet Take 1 tablet (5 mg total) by mouth daily. 90 tablet 3   metoprolol  succinate (TOPROL -XL) 25 MG 24 hr tablet TAKE 1 TABLET BY MOUTH DAILY 100 tablet 2   pravastatin  (PRAVACHOL ) 40 MG tablet TAKE 1 TABLET BY MOUTH DAILY 100 tablet 2   valsartan -hydrochlorothiazide  (DIOVAN -HCT) 160-25 MG tablet TAKE 1 TABLET BY MOUTH DAILY 100 tablet 2   No current facility-administered medications on file prior to visit.   No Known Allergies Family History  Problem Relation Age of Onset   Hypertension Mother    Hypertension Father    Thyroid  disease Sister    PE: BP 138/86 (BP Location: Left Arm, Patient Position: Sitting, Cuff Size: Normal)   Pulse 75   Ht 6' (1.829 m)   Wt 182 lb 9.6 oz (82.8 kg)   SpO2 96%   BMI 24.77 kg/m  Wt Readings from Last 10 Encounters:  12/09/23 182 lb 9.6 oz  (82.8 kg)  10/22/23 183 lb 6.4 oz (83.2 kg)  06/03/23 180 lb 6.4 oz (81.8 kg)  05/13/23 182 lb 6.4 oz (82.7 kg)  01/14/23 178 lb 9.6 oz (81 kg)  12/03/22 180 lb 3.2 oz (81.7 kg)  09/05/22 181 lb (82.1 kg)  05/29/22 178 lb (80.7 kg)  05/28/22 178 lb 3.2 oz (80.8 kg)  04/25/22 177 lb 6.4 oz (80.5 kg)   Constitutional:  Slightly overweight, in NAD Eyes: no exophthalmos, no lid lag, no stare ENT: no thyromegaly, no cervical lymphadenopathy Cardiovascular: RRR, No MRG Respiratory: CTA B Musculoskeletal: no deformities Skin: no rashes Neurological: no tremor with outstretched hands  ASSESSMENT: 1. Graves ds.  PLAN:  1. Patient with history of low TSH with high free thyroid  hormones, with thyrotoxic symptoms including weight loss, heat intolerance, palpitations, hyperdefecation, anxiety, all resolved after starting methimazole .  He was previously seen by Dr. Kassie.  When I first saw him, his TSH was elevated, at 10.  TSI antibodies returned elevated, confirming Graves' disease.  I advised him to stop methimazole  and return for labs but his TSH became suppressed again so we had to start back on methimazole , at a lower dose, 5 mg daily.  He subsequently decreased the dose of methimazole  to 5 mg every other day, but the TSH was suppressed on this dose in 05/2022 so we increased the dose back to 5 mg daily.  He continues on this dose.  His TSI's were still elevated at last check, but improving, at 406. - He denies thyrotoxic signs or symptoms including weight loss, tremors, palpitations, heat intolerance, anxiety - He tolerates methimazole  well.  For now, we will continue this at the same dose. - Latest TFTs were normal in 05/2023.  At today's visit, we will recheck his TFTs and adjust the methimazole  dose accordingly - We discussed in the past about possible use of RAI ablation or thyroidectomy but we did not have to use these for him - He has no signs of active Graves' ophthalmopathy: Double  vision, blurry vision, eye pain.  However, he continues to mention eye itching due to allergies.  I suggested that he may need to see an other colleges. - He continues on a beta-blocker (Toprol -XL 25 mg daily).  No tachycardia. - I will see him back in 6 months but possibly sooner for labs Patient Instructions  For now, continue: - Methimazole  5 mg every day - Toprol  XL 25 mg daily  Please stop at the lab.  Please return in 1 year, but in 6 months for a lab appointment.  Needs refills - [facility] -to reset the prescription for the next year until I see him back.  Orders Placed This Encounter  Procedures   TSH   T4, free   T3, free   Lela Fendt, MD PhD [facility]

## 2023-12-10 ENCOUNTER — Ambulatory Visit: Payer: Self-pay | Admitting: Internal Medicine

## 2023-12-10 MED ORDER — METHIMAZOLE 5 MG PO TABS: 5.0000 mg | ORAL_TABLET | ORAL | 3 refills | Status: AC

## 2023-12-10 NOTE — Addendum Note (Signed)
 Addended by: TRIXIE FILE on: 12/10/2023 12:07 PM   Modules accepted: Orders

## 2024-01-08 ENCOUNTER — Other Ambulatory Visit

## 2024-01-09 LAB — TSH: TSH: 3.98 m[IU]/L (ref 0.40–4.50)

## 2024-01-09 LAB — T4, FREE: Free T4: 1.2 ng/dL (ref 0.8–1.8)

## 2024-01-09 LAB — T3, FREE: T3, Free: 3.4 pg/mL (ref 2.3–4.2)

## 2024-03-03 ENCOUNTER — Encounter: Payer: Self-pay | Admitting: Internal Medicine

## 2024-03-03 ENCOUNTER — Ambulatory Visit (INDEPENDENT_AMBULATORY_CARE_PROVIDER_SITE_OTHER): Payer: Self-pay | Admitting: Internal Medicine

## 2024-03-03 VITALS — BP 122/78 | HR 85 | Temp 98.2°F | Ht 72.0 in | Wt 185.6 lb

## 2024-03-03 DIAGNOSIS — E0590 Thyrotoxicosis, unspecified without thyrotoxic crisis or storm: Secondary | ICD-10-CM

## 2024-03-03 DIAGNOSIS — R7303 Prediabetes: Secondary | ICD-10-CM | POA: Diagnosis not present

## 2024-03-03 DIAGNOSIS — N182 Chronic kidney disease, stage 2 (mild): Secondary | ICD-10-CM

## 2024-03-03 DIAGNOSIS — I129 Hypertensive chronic kidney disease with stage 1 through stage 4 chronic kidney disease, or unspecified chronic kidney disease: Secondary | ICD-10-CM

## 2024-03-03 MED ORDER — VALSARTAN-HYDROCHLOROTHIAZIDE 160-25 MG PO TABS: 1.0000 | ORAL_TABLET | Freq: Every day | ORAL | 2 refills | Status: AC

## 2024-03-03 MED ORDER — PRAVASTATIN SODIUM 40 MG PO TABS: 40.0000 mg | ORAL_TABLET | Freq: Every day | ORAL | 2 refills | Status: AC

## 2024-03-03 MED ORDER — AMLODIPINE BESYLATE 10 MG PO TABS: 10.0000 mg | ORAL_TABLET | Freq: Every day | ORAL | 2 refills | Status: AC

## 2024-03-03 MED ORDER — METOPROLOL SUCCINATE ER 25 MG PO TB24: 25.0000 mg | ORAL_TABLET | Freq: Every day | ORAL | 2 refills | Status: AC

## 2024-03-03 NOTE — Progress Notes (Signed)
 LILLETTE Kristeen JINNY Gladis, CMA,acting as a scribe for Catheryn LOISE Slocumb, MD.,have documented all relevant documentation on the behalf of Catheryn LOISE Slocumb, MD,as directed by  Catheryn LOISE Slocumb, MD while in the presence of Catheryn LOISE Slocumb, MD.  Subjective:  Patient ID: Lawrence Harrell , male    DOB: 1945-11-28 , 78 y.o.   MRN: [id number]  Chief Complaint  Patient presents with   Hypertension    Patient presents today for a bp and predm follow up, Patient reports compliance with medication. Patient denies any chest pain, SOB, or headaches. Patient has no concerns today.     HPI Discussed the use of AI scribe software for clinical note transcription with the patient, who gave verbal consent to proceed.  History of Present Illness Lawrence Harrell is a 78 year old male who presents for a blood pressure check.  He has no specific concerns or complaints at this time.   He is currently taking amlodipine  10 mg, which he is about to run out of, and metoprolol , which was last refilled in October. He also takes methimazole  5 mg every other day for thyroid  management and pravastatin  40 mg for cholesterol.  He reports that he has gained weight since his last visit and is now up to 185 pounds. His appetite is good, and he is eating well.  His water intake is good, consuming about three to four bottles a day, sometimes more.   Hypertension This is a chronic problem. The current episode started more than 1 year ago. The problem has been gradually improving since onset. The problem is controlled. Pertinent negatives include no blurred vision, chest pain, palpitations or shortness of breath. Risk factors for coronary artery disease include dyslipidemia and male gender. The current treatment provides moderate improvement. Hypertensive end-organ damage includes kidney disease.     Past Medical History:  Diagnosis Date   BPH with obstruction/lower urinary tract symptoms    High cholesterol    HTN (hypertension)     Prediabetes    Renal cell cancer, right (HCC)      Family History  Problem Relation Age of Onset   Hypertension Mother    Hypertension Father    Thyroid  disease Sister      Current Outpatient Medications:    Cholecalciferol (VITAMIN D3) 2000 units TABS, Take by mouth., Disp: , Rfl:    methimazole  (TAPAZOLE ) 5 MG tablet, Take 1 tablet (5 mg total) by mouth every other day., Disp: 45 tablet, Rfl: 3   amLODipine  (NORVASC ) 10 MG tablet, Take 1 tablet (10 mg total) by mouth daily., Disp: 100 tablet, Rfl: 2   metoprolol  succinate (TOPROL -XL) 25 MG 24 hr tablet, Take 1 tablet (25 mg total) by mouth daily., Disp: 100 tablet, Rfl: 2   pravastatin  (PRAVACHOL ) 40 MG tablet, Take 1 tablet (40 mg total) by mouth daily., Disp: 100 tablet, Rfl: 2   valsartan -hydrochlorothiazide  (DIOVAN -HCT) 160-25 MG tablet, Take 1 tablet by mouth daily., Disp: 100 tablet, Rfl: 2   No Known Allergies   Review of Systems  Constitutional: Negative.   Eyes:  Negative for blurred vision.  Respiratory: Negative.  Negative for shortness of breath.   Cardiovascular: Negative.  Negative for chest pain and palpitations.  Gastrointestinal: Negative.   Skin: Negative.   Allergic/Immunologic: Negative.   Hematological: Negative.      Today's Vitals   03/03/24 1159  BP: 122/78  Pulse: 85  Temp: 98.2 F (36.8 C)  TempSrc: Oral  SpO2: 98%  Weight: 185 lb 9.6  oz (84.2 kg)  Height: 6' (1.829 m)   Body mass index is 25.17 kg/m.  Wt Readings from Last 3 Encounters:  03/03/24 185 lb 9.6 oz (84.2 kg)  12/09/23 182 lb 9.6 oz (82.8 kg)  10/22/23 183 lb 6.4 oz (83.2 kg)    The 10-year ASCVD risk score (Arnett DK, et al., 2019) is: 20.8%   Values used to calculate the score:     Age: 44 years     Clinically relevant sex: Male     Is Non-Hispanic African American: Yes     Diabetic: No     Tobacco smoker: No     Systolic Blood Pressure: 122 mmHg     Is BP treated: Yes     HDL Cholesterol: 65 mg/dL     Total  Cholesterol: 186 mg/dL  Objective:  Physical Exam Vitals and nursing note reviewed.  Constitutional:      Appearance: Normal appearance.  HENT:     Head: Normocephalic and atraumatic.  Eyes:     Extraocular Movements: Extraocular movements intact.  Cardiovascular:     Rate and Rhythm: Normal rate and regular rhythm.     Heart sounds: Normal heart sounds.  Pulmonary:     Effort: Pulmonary effort is normal.     Breath sounds: Normal breath sounds.  Musculoskeletal:     Cervical back: Normal range of motion.  Skin:    General: Skin is warm.  Neurological:     General: No focal deficit present.     Mental Status: He is alert.  Psychiatric:        Mood and Affect: Mood normal.      Assessment And Plan:   Assessment & Plan Hypertensive nephropathy Chronic. Blood pressure well-controlled with current regimen. - Refilled amlodipine  10 mg via [facility]. - Ordered liver and kidney function tests. Chronic renal disease, stage II Chronic, reminded to avoid NSAIDS, stay well hydrated and keep BP well controlled to decrease risk of CKD progression.  Prediabetes Previous labs reviewed, his A1c has been elevated in the past. I will check an A1c today. Reminded to avoid refined sugars including sugary drinks/foods and processed meats including bacon, sausages and deli meats.   Hyperthyroidism Chronic, followed by Endo.  Thyroid  function last checked in October. Current regimen includes methimazole  and metoprolol . - Continue methimazole  5 mg every other day. - Continue metoprolol  as prescribed.   Orders Placed This Encounter  Procedures   Hemoglobin A1c   CMP14+EGFR     Return for keep same next.  Patient was given opportunity to ask questions. Patient verbalized understanding of the plan and was able to repeat key elements of the plan. All questions were answered to their satisfaction.    I, Catheryn LOISE Slocumb, MD, have reviewed all documentation for this visit. The documentation  on 03/07/2024 for the exam, diagnosis, procedures, and orders are all accurate and complete.   IF YOU HAVE BEEN REFERRED TO A SPECIALIST, IT MAY TAKE 1-2 WEEKS TO SCHEDULE/PROCESS THE REFERRAL. IF YOU HAVE NOT HEARD FROM US /SPECIALIST IN TWO WEEKS, PLEASE GIVE US  A CALL AT [phone].

## 2024-03-04 LAB — CMP14+EGFR
ALT: 11 IU/L (ref 0–44)
AST: 18 IU/L (ref 0–40)
Albumin: 4.1 g/dL (ref 3.8–4.8)
Alkaline Phosphatase: 93 IU/L (ref 47–123)
BUN/Creatinine Ratio: 22 (ref 10–24)
BUN: 27 mg/dL (ref 8–27)
Bilirubin Total: 0.5 mg/dL (ref 0.0–1.2)
CO2: 27 mmol/L (ref 20–29)
Calcium: 9.4 mg/dL (ref 8.6–10.2)
Chloride: 101 mmol/L (ref 96–106)
Creatinine, Ser: 1.25 mg/dL (ref 0.76–1.27)
Globulin, Total: 3.2 g/dL (ref 1.5–4.5)
Glucose: 84 mg/dL (ref 70–99)
Potassium: 3.4 mmol/L — ABNORMAL LOW (ref 3.5–5.2)
Sodium: 145 mmol/L — ABNORMAL HIGH (ref 134–144)
Total Protein: 7.3 g/dL (ref 6.0–8.5)
eGFR: 59 mL/min/1.73 — ABNORMAL LOW

## 2024-03-04 LAB — HEMOGLOBIN A1C
Est. average glucose Bld gHb Est-mCnc: 123 mg/dL
Hgb A1c MFr Bld: 5.9 % — ABNORMAL HIGH (ref 4.8–5.6)

## 2024-03-05 ENCOUNTER — Ambulatory Visit: Payer: Self-pay | Admitting: Internal Medicine

## 2024-03-07 NOTE — Assessment & Plan Note (Signed)
 Chronic, followed by Endo.  Thyroid  function last checked in October. Current regimen includes methimazole  and metoprolol . - Continue methimazole  5 mg every other day. - Continue metoprolol  as prescribed.

## 2024-03-07 NOTE — Assessment & Plan Note (Signed)
 Chronic. Blood pressure well-controlled with current regimen. - Refilled amlodipine  10 mg via [facility]. - Ordered liver and kidney function tests.

## 2024-03-07 NOTE — Assessment & Plan Note (Signed)
Chronic, reminded to avoid NSAIDS, stay well hydrated and keep BP well controlled to decrease risk of CKD progression.

## 2024-05-20 ENCOUNTER — Encounter: Admitting: Internal Medicine

## 2024-05-31 ENCOUNTER — Encounter: Admitting: Internal Medicine

## 2024-06-07 ENCOUNTER — Other Ambulatory Visit

## 2024-06-21 ENCOUNTER — Encounter: Payer: Self-pay | Admitting: Internal Medicine

## 2024-07-07 ENCOUNTER — Ambulatory Visit: Payer: Self-pay

## 2024-07-07 ENCOUNTER — Ambulatory Visit

## 2024-12-08 ENCOUNTER — Ambulatory Visit: Admitting: Internal Medicine
